# Patient Record
Sex: Female | Born: 1955
Health system: Southern US, Community
[De-identification: ages and names within clinical notes are randomized; demographics above are authoritative.]

## PROBLEM LIST (undated history)

## (undated) DIAGNOSIS — M8580 Other specified disorders of bone density and structure, unspecified site: Secondary | ICD-10-CM

## (undated) DIAGNOSIS — E785 Hyperlipidemia, unspecified: Secondary | ICD-10-CM

## (undated) HISTORY — DX: Other specified disorders of bone density and structure, unspecified site: M85.80

---
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
## 1999-10-11 ENCOUNTER — Encounter: Payer: Self-pay | Admitting: Obstetrics and Gynecology

## 1999-10-11 ENCOUNTER — Encounter: Admission: RE | Admit: 1999-10-11 | Discharge: 1999-10-11 | Payer: Self-pay | Admitting: Obstetrics and Gynecology

## 1999-10-24 ENCOUNTER — Other Ambulatory Visit: Admission: RE | Admit: 1999-10-24 | Discharge: 1999-10-24 | Payer: Self-pay | Admitting: *Deleted

## 2000-11-24 ENCOUNTER — Encounter: Admission: RE | Admit: 2000-11-24 | Discharge: 2000-11-24 | Payer: Self-pay | Admitting: Obstetrics and Gynecology

## 2000-11-24 ENCOUNTER — Encounter: Payer: Self-pay | Admitting: Obstetrics and Gynecology

## 2000-12-01 ENCOUNTER — Other Ambulatory Visit: Admission: RE | Admit: 2000-12-01 | Discharge: 2000-12-01 | Payer: Self-pay | Admitting: Obstetrics and Gynecology

## 2002-04-19 ENCOUNTER — Encounter: Admission: RE | Admit: 2002-04-19 | Discharge: 2002-04-19 | Payer: Self-pay | Admitting: Internal Medicine

## 2002-04-19 ENCOUNTER — Encounter: Payer: Self-pay | Admitting: Internal Medicine

## 2002-07-08 ENCOUNTER — Other Ambulatory Visit: Admission: RE | Admit: 2002-07-08 | Discharge: 2002-07-08 | Payer: Self-pay | Admitting: Obstetrics and Gynecology

## 2003-01-04 ENCOUNTER — Encounter: Payer: Self-pay | Admitting: Obstetrics and Gynecology

## 2003-01-04 ENCOUNTER — Encounter: Admission: RE | Admit: 2003-01-04 | Discharge: 2003-01-04 | Payer: Self-pay | Admitting: Obstetrics and Gynecology

## 2004-02-20 ENCOUNTER — Encounter: Admission: RE | Admit: 2004-02-20 | Discharge: 2004-02-20 | Payer: Self-pay | Admitting: Obstetrics and Gynecology

## 2004-04-23 ENCOUNTER — Emergency Department (HOSPITAL_COMMUNITY): Admission: EM | Admit: 2004-04-23 | Discharge: 2004-04-23 | Payer: Self-pay | Admitting: Family Medicine

## 2004-07-02 ENCOUNTER — Emergency Department (HOSPITAL_COMMUNITY): Admission: EM | Admit: 2004-07-02 | Discharge: 2004-07-02 | Payer: Self-pay | Admitting: *Deleted

## 2004-12-03 ENCOUNTER — Encounter: Admission: RE | Admit: 2004-12-03 | Discharge: 2004-12-03 | Payer: Self-pay | Admitting: Internal Medicine

## 2004-12-10 ENCOUNTER — Encounter: Admission: RE | Admit: 2004-12-10 | Discharge: 2004-12-10 | Payer: Self-pay | Admitting: Internal Medicine

## 2005-05-20 ENCOUNTER — Ambulatory Visit (HOSPITAL_COMMUNITY): Admission: RE | Admit: 2005-05-20 | Discharge: 2005-05-20 | Payer: Self-pay | Admitting: Internal Medicine

## 2005-06-05 ENCOUNTER — Encounter: Admission: RE | Admit: 2005-06-05 | Discharge: 2005-06-05 | Payer: Self-pay | Admitting: Internal Medicine

## 2005-10-16 ENCOUNTER — Encounter: Admission: RE | Admit: 2005-10-16 | Discharge: 2005-10-16 | Payer: Self-pay | Admitting: *Deleted

## 2008-05-05 ENCOUNTER — Encounter: Admission: RE | Admit: 2008-05-05 | Discharge: 2008-05-05 | Payer: Self-pay | Admitting: Obstetrics and Gynecology

## 2008-05-10 ENCOUNTER — Encounter: Admission: RE | Admit: 2008-05-10 | Discharge: 2008-05-10 | Payer: Self-pay | Admitting: Obstetrics and Gynecology

## 2008-11-06 ENCOUNTER — Encounter: Admission: RE | Admit: 2008-11-06 | Discharge: 2008-11-06 | Payer: Self-pay | Admitting: Obstetrics and Gynecology

## 2009-08-10 ENCOUNTER — Encounter: Admission: RE | Admit: 2009-08-10 | Discharge: 2009-08-10 | Payer: Self-pay | Admitting: Obstetrics and Gynecology

## 2011-05-19 ENCOUNTER — Encounter: Payer: Self-pay | Admitting: Internal Medicine

## 2011-05-20 ENCOUNTER — Encounter: Payer: Self-pay | Admitting: Internal Medicine

## 2011-05-20 ENCOUNTER — Ambulatory Visit (INDEPENDENT_AMBULATORY_CARE_PROVIDER_SITE_OTHER): Payer: BC Managed Care – PPO | Admitting: Internal Medicine

## 2011-05-20 VITALS — BP 132/80 | HR 70 | Temp 98.4°F | Ht 65.0 in | Wt 196.0 lb

## 2011-05-20 DIAGNOSIS — J45909 Unspecified asthma, uncomplicated: Secondary | ICD-10-CM

## 2011-05-20 DIAGNOSIS — R52 Pain, unspecified: Secondary | ICD-10-CM

## 2011-05-20 DIAGNOSIS — K219 Gastro-esophageal reflux disease without esophagitis: Secondary | ICD-10-CM

## 2011-05-22 ENCOUNTER — Ambulatory Visit
Admission: RE | Admit: 2011-05-22 | Discharge: 2011-05-22 | Disposition: A | Discharge status: Home / Self Care | Payer: BC Managed Care – PPO | Source: Ambulatory Visit | Attending: Internal Medicine | Admitting: Internal Medicine

## 2011-05-22 DIAGNOSIS — R52 Pain, unspecified: Secondary | ICD-10-CM

## 2011-05-23 ENCOUNTER — Other Ambulatory Visit: Payer: Self-pay | Admitting: Internal Medicine

## 2011-05-23 ENCOUNTER — Telehealth: Payer: Self-pay

## 2011-05-23 DIAGNOSIS — M5410 Radiculopathy, site unspecified: Secondary | ICD-10-CM

## 2011-05-23 DIAGNOSIS — R937 Abnormal findings on diagnostic imaging of other parts of musculoskeletal system: Secondary | ICD-10-CM

## 2011-05-23 NOTE — Telephone Encounter (Signed)
Patient in need of additional imaging, per Radiologist due to C7-T1disc bulge with spurring /epidural hemorrhage. Ordered in epic MRI c spine w/contrast

## 2011-05-23 NOTE — Telephone Encounter (Signed)
Patient informed of MRI C spine results

## 2011-05-24 ENCOUNTER — Ambulatory Visit
Admission: RE | Admit: 2011-05-24 | Discharge: 2011-05-24 | Disposition: A | Discharge status: Home / Self Care | Payer: BC Managed Care – PPO | Source: Ambulatory Visit | Attending: Internal Medicine | Admitting: Internal Medicine

## 2011-05-24 ENCOUNTER — Other Ambulatory Visit: Payer: Self-pay | Admitting: Internal Medicine

## 2011-05-24 DIAGNOSIS — M5410 Radiculopathy, site unspecified: Secondary | ICD-10-CM

## 2011-05-24 MED ORDER — GADOBENATE DIMEGLUMINE 529 MG/ML IV SOLN
19.0000 mL | Freq: Once | INTRAVENOUS | Status: AC | PRN
  Administered 2011-05-24: 19 mL via INTRAVENOUS

## 2011-05-26 ENCOUNTER — Encounter: Payer: Self-pay | Admitting: Internal Medicine

## 2011-05-26 NOTE — Progress Notes (Signed)
  Subjective:    Patient ID: [name], female    DOB: May 24, 1956, 55 y.o.   MRN: [id number]  HPI patient has recently developed neck and right arm pain with right hand numbness. Has pain in right shoulder. Tried over-the-counter analgesics without relief. Doesn't recall any injury to neck or arm. All fingers are numb in her right hand. Never had anything like this before. Patient has a history of GE reflux, asthma, osteopenia. History of left lower lobe pneumonia March 2006. No known drug allergies but codeine causes dizziness. OB/GYN is [facility]. 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 Is married. Not seen here since 2009.    Review of Systems     Objective:   Physical Exam cranial nerves II through XII are grossly intact. Has tenderness right trapezius and right paracervical muscles. Numbness in all 5 fingers right hand. Muscle strength 4/5 in right upper extremity 5 over 5 in left upper extremity. Deep tendon reflexes 1+ and symmetrical in right upper extremity.        Assessment & Plan:  Neck pain and right cervical radiculopathy rule out herniated disc   Plan patient is to be placed on a Sterapred DS 10 mg 6 day dosepak, Flexeril 10 mg (#30) 1/2-1 by mouth each bedtime. Order CT of the cervical spine. Followup in one week.

## 2011-05-29 ENCOUNTER — Encounter: Payer: Self-pay | Admitting: Internal Medicine

## 2011-05-29 ENCOUNTER — Ambulatory Visit (INDEPENDENT_AMBULATORY_CARE_PROVIDER_SITE_OTHER): Payer: BC Managed Care – PPO | Admitting: Internal Medicine

## 2011-05-29 DIAGNOSIS — M5410 Radiculopathy, site unspecified: Secondary | ICD-10-CM

## 2011-05-29 DIAGNOSIS — IMO0002 Reserved for concepts with insufficient information to code with codable children: Secondary | ICD-10-CM

## 2011-05-29 DIAGNOSIS — M5412 Radiculopathy, cervical region: Secondary | ICD-10-CM

## 2011-05-29 NOTE — Progress Notes (Signed)
  Subjective:    Patient ID: [name], female    DOB: 1955-10-22, 55 y.o.   MRN: [id number]  HPI patient in today for followup of pain in lower paracervical spine area and radiculopathy right arm. Last visit was treated with Flexeril, Sterapred DS 10 mg 6 day dosepak. She had an MRI without contrast showing disc bulge with some uncinate spurring with moderate right and borderline left foraminal stenosis. However radiologist thought he saw an abnormality in the upper cervical spine in the epidural soft tissue. This turns out to be a prominent venous plexus. No abnormal enhancement identified. No hematoma. No abscess. Patient was reassured. Patient says numbness in her right hand is a bit better but not a lot. Still having considerable lower neck pain. Has had to take Flexeril at night after getting home from work. It has been one week since we started treatment. Last weekend helped her family heart is some grapes. She has been doing this recently which may have aggravated her situation. I would like for her not to do this until she is better. Also reminded no heavy lifting. 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    Review of Systems     Objective:   Physical Exam persistent numbness right hand and pain lower paracervical spine. Able to type at work.        Assessment & Plan:  Disc bulge C7-T1 with moderate right foraminal stenosis (right radiculopathy)  Refill Flexeril 10 mg #90 one half to one by mouth 3 times a day  Repeat Sterapred DS 10 mg 6 day dosepak. Appointment with neurosurgeon.

## 2011-05-30 ENCOUNTER — Ambulatory Visit: Payer: BC Managed Care – PPO | Admitting: Internal Medicine

## 2011-05-30 ENCOUNTER — Telehealth: Payer: Self-pay

## 2011-05-30 NOTE — Telephone Encounter (Signed)
Patient scheduled for appointment with Dr. Venetia Maxon on Jun 16, 2011 at 8:30 a.m.Marland Kitchen Patient  aware

## 2011-07-24 ENCOUNTER — Other Ambulatory Visit: Payer: Self-pay | Admitting: Neurosurgery

## 2011-08-25 ENCOUNTER — Encounter (HOSPITAL_COMMUNITY): Payer: Self-pay | Admitting: Pharmacy Technician

## 2011-09-03 NOTE — Pre-Procedure Instructions (Signed)
20 YOSHINO BROCCOLI  09/03/2011   Your procedure is scheduled on:09-11-2011 @ 7:30 AM Report to [facility] at 5:30 AM.  Call this number if you have problems the morning of surgery: [phone]   Remember:   Do not eat food:After Midnight.  May have clear liquids: up to 4 Hours before arrival.  Clear liquids include soda, tea, black coffee, apple or grape juice, broth. Until 1:30 AM  Take these medicines the morning of surgery with A SIP OF WATER:   Do not wear jewelry, make-up or nail polish.  Do not wear lotions, powders, or perfumes. You may wear deodorant.  Do not shave 48 hours prior to surgery.  Do not bring valuables to the hospital.  Contacts, dentures or bridgework may not be worn into surgery.  Leave suitcase in the car. After surgery it may be brought to your room.  For patients admitted to the hospital, checkout time is 11:00 AM the day of discharge.   Patients discharged the day of surgery will not be allowed to drive home.  Name and phone number of your driver:  Special Instructions: CHG Shower Use Special Wash: 1/2 bottle night before surgery and 1/2 bottle morning of surgery.   Please read over the following fact sheets that you were given: Pain Booklet, MRSA Information and Surgical Site Infection Prevention

## 2011-09-04 ENCOUNTER — Encounter (HOSPITAL_COMMUNITY): Payer: Self-pay

## 2011-09-04 ENCOUNTER — Encounter (HOSPITAL_COMMUNITY)
Admission: RE | Admit: 2011-09-04 | Discharge: 2011-09-04 | Disposition: A | Discharge status: Home / Self Care | Payer: BC Managed Care – PPO | Source: Ambulatory Visit | Attending: Neurosurgery | Admitting: Neurosurgery

## 2011-09-04 HISTORY — DX: Hyperlipidemia, unspecified: E78.5

## 2011-09-04 LAB — CBC
HCT: 39 % (ref 36.0–46.0)
Hemoglobin: 12.8 g/dL (ref 12.0–15.0)
RBC: 4.45 MIL/uL (ref 3.87–5.11)
RDW: 13.3 % (ref 11.5–15.5)

## 2011-09-10 MED ORDER — CEFAZOLIN SODIUM 1-5 GM-% IV SOLN
1.0000 g | INTRAVENOUS | Status: AC
  Administered 2011-09-11: 1 g via INTRAVENOUS
  Filled 2011-09-10: qty 50

## 2011-09-11 ENCOUNTER — Ambulatory Visit (HOSPITAL_COMMUNITY)
Admission: RE | Admit: 2011-09-11 | Discharge: 2011-09-12 | Disposition: A | Discharge status: Home / Self Care | Payer: BC Managed Care – PPO | Source: Ambulatory Visit | Attending: Neurosurgery | Admitting: Neurosurgery

## 2011-09-11 ENCOUNTER — Encounter (HOSPITAL_COMMUNITY)
Admission: RE | Disposition: A | Discharge status: Home / Self Care | Payer: Self-pay | Source: Ambulatory Visit | Attending: Neurosurgery

## 2011-09-11 ENCOUNTER — Encounter (HOSPITAL_COMMUNITY): Payer: Self-pay | Admitting: *Deleted

## 2011-09-11 ENCOUNTER — Ambulatory Visit (HOSPITAL_COMMUNITY): Payer: BC Managed Care – PPO | Admitting: Anesthesiology

## 2011-09-11 ENCOUNTER — Encounter (HOSPITAL_COMMUNITY): Payer: Self-pay | Admitting: Anesthesiology

## 2011-09-11 ENCOUNTER — Ambulatory Visit (HOSPITAL_COMMUNITY): Payer: BC Managed Care – PPO

## 2011-09-11 DIAGNOSIS — J45909 Unspecified asthma, uncomplicated: Secondary | ICD-10-CM | POA: Insufficient documentation

## 2011-09-11 DIAGNOSIS — E785 Hyperlipidemia, unspecified: Secondary | ICD-10-CM | POA: Insufficient documentation

## 2011-09-11 DIAGNOSIS — Z01812 Encounter for preprocedural laboratory examination: Secondary | ICD-10-CM | POA: Insufficient documentation

## 2011-09-11 DIAGNOSIS — M5020 Other cervical disc displacement, unspecified cervical region: Secondary | ICD-10-CM | POA: Insufficient documentation

## 2011-09-11 DIAGNOSIS — K219 Gastro-esophageal reflux disease without esophagitis: Secondary | ICD-10-CM | POA: Insufficient documentation

## 2011-09-11 DIAGNOSIS — M47812 Spondylosis without myelopathy or radiculopathy, cervical region: Secondary | ICD-10-CM | POA: Insufficient documentation

## 2011-09-11 HISTORY — PX: ANTERIOR CERVICAL DECOMP/DISCECTOMY FUSION: SHX1161

## 2011-09-11 SURGERY — ANTERIOR CERVICAL DECOMPRESSION/DISCECTOMY FUSION 1 LEVEL
Anesthesia: General | Site: Neck | Wound class: Clean

## 2011-09-11 MED ORDER — PROPOFOL 10 MG/ML IV EMUL
INTRAVENOUS | Status: DC | PRN
  Administered 2011-09-11: 160 mg via INTRAVENOUS
  Administered 2011-09-11: 40 mg via INTRAVENOUS

## 2011-09-11 MED ORDER — PROMETHAZINE HCL 25 MG/ML IJ SOLN
INTRAMUSCULAR | Status: AC
  Filled 2011-09-11: qty 1

## 2011-09-11 MED ORDER — HYDROCODONE-ACETAMINOPHEN 5-325 MG PO TABS
1.0000 | ORAL_TABLET | ORAL | Status: DC | PRN
  Administered 2011-09-11 – 2011-09-12 (×5): 1 via ORAL
  Filled 2011-09-11 (×5): qty 1

## 2011-09-11 MED ORDER — ZOLPIDEM TARTRATE 5 MG PO TABS: 10.0000 mg | ORAL_TABLET | Freq: Every evening | ORAL | Status: DC | PRN

## 2011-09-11 MED ORDER — DEXAMETHASONE SODIUM PHOSPHATE 4 MG/ML IJ SOLN
INTRAMUSCULAR | Status: DC | PRN
  Administered 2011-09-11: 10 mg via INTRAVENOUS

## 2011-09-11 MED ORDER — MIDAZOLAM HCL 5 MG/5ML IJ SOLN
INTRAMUSCULAR | Status: DC | PRN
  Administered 2011-09-11: 2 mg via INTRAVENOUS

## 2011-09-11 MED ORDER — ACETAMINOPHEN 325 MG PO TABS: 650.0000 mg | ORAL_TABLET | ORAL | Status: DC | PRN

## 2011-09-11 MED ORDER — PROMETHAZINE HCL 25 MG/ML IJ SOLN
6.2500 mg | INTRAMUSCULAR | Status: DC | PRN
  Administered 2011-09-11: 6.25 mg via INTRAVENOUS

## 2011-09-11 MED ORDER — ONDANSETRON HCL 4 MG/2ML IJ SOLN
INTRAMUSCULAR | Status: DC | PRN
  Administered 2011-09-11: 4 mg via INTRAVENOUS

## 2011-09-11 MED ORDER — CEFAZOLIN SODIUM 1-5 GM-% IV SOLN
1.0000 g | Freq: Three times a day (TID) | INTRAVENOUS | Status: AC
  Administered 2011-09-11 (×2): 1 g via INTRAVENOUS
  Filled 2011-09-11 (×2): qty 50

## 2011-09-11 MED ORDER — BUPIVACAINE HCL (PF) 0.5 % IJ SOLN
INTRAMUSCULAR | Status: DC | PRN
  Administered 2011-09-11: 30 mL

## 2011-09-11 MED ORDER — LIDOCAINE HCL (CARDIAC) 20 MG/ML IV SOLN
INTRAVENOUS | Status: DC | PRN
  Administered 2011-09-11: 60 mg via INTRAVENOUS

## 2011-09-11 MED ORDER — HEMOSTATIC AGENTS (NO CHARGE) OPTIME
TOPICAL | Status: DC | PRN
  Administered 2011-09-11: 1 via TOPICAL

## 2011-09-11 MED ORDER — ALUM & MAG HYDROXIDE-SIMETH 200-200-20 MG/5ML PO SUSP: 30.0000 mL | Freq: Four times a day (QID) | ORAL | Status: DC | PRN

## 2011-09-11 MED ORDER — KCL IN DEXTROSE-NACL 20-5-0.45 MEQ/L-%-% IV SOLN
INTRAVENOUS | Status: DC
  Filled 2011-09-11 (×3): qty 1000

## 2011-09-11 MED ORDER — MORPHINE SULFATE 4 MG/ML IJ SOLN: 1.0000 mg | INTRAMUSCULAR | Status: DC | PRN

## 2011-09-11 MED ORDER — SODIUM CHLORIDE 0.9 % IV SOLN
INTRAVENOUS | Status: AC
  Filled 2011-09-11: qty 500

## 2011-09-11 MED ORDER — HYDROMORPHONE HCL PF 1 MG/ML IJ SOLN
INTRAMUSCULAR | Status: AC
  Filled 2011-09-11: qty 1

## 2011-09-11 MED ORDER — SODIUM CHLORIDE 0.9 % IR SOLN
Status: DC | PRN
  Administered 2011-09-11: 09:00:00

## 2011-09-11 MED ORDER — METHOCARBAMOL 500 MG PO TABS
500.0000 mg | ORAL_TABLET | Freq: Four times a day (QID) | ORAL | Status: DC | PRN
  Administered 2011-09-11 – 2011-09-12 (×3): 500 mg via ORAL
  Filled 2011-09-11 (×3): qty 1

## 2011-09-11 MED ORDER — ROCURONIUM BROMIDE 100 MG/10ML IV SOLN
INTRAVENOUS | Status: DC | PRN
  Administered 2011-09-11: 50 mg via INTRAVENOUS

## 2011-09-11 MED ORDER — LACTATED RINGERS IV SOLN
INTRAVENOUS | Status: DC | PRN
  Administered 2011-09-11 (×2): via INTRAVENOUS

## 2011-09-11 MED ORDER — THROMBIN 5000 UNITS EX KIT
PACK | CUTANEOUS | Status: DC | PRN
  Administered 2011-09-11 (×2): 5000 [IU] via TOPICAL

## 2011-09-11 MED ORDER — DIAZEPAM 5 MG PO TABS
5.0000 mg | ORAL_TABLET | Freq: Four times a day (QID) | ORAL | Status: DC | PRN
Start: 2011-09-11 — End: 2011-09-12

## 2011-09-11 MED ORDER — DOCUSATE SODIUM 100 MG PO CAPS
100.0000 mg | ORAL_CAPSULE | Freq: Two times a day (BID) | ORAL | Status: DC
  Administered 2011-09-11 – 2011-09-12 (×2): 100 mg via ORAL
  Filled 2011-09-11 (×2): qty 1

## 2011-09-11 MED ORDER — LORAZEPAM 2 MG/ML IJ SOLN: 1.0000 mg | Freq: Once | INTRAMUSCULAR | Status: DC | PRN

## 2011-09-11 MED ORDER — ACETAMINOPHEN 650 MG RE SUPP: 650.0000 mg | RECTAL | Status: DC | PRN

## 2011-09-11 MED ORDER — MENTHOL 3 MG MT LOZG: 1.0000 | LOZENGE | OROMUCOSAL | Status: DC | PRN

## 2011-09-11 MED ORDER — OXYCODONE-ACETAMINOPHEN 5-325 MG PO TABS: 1.0000 | ORAL_TABLET | ORAL | Status: DC | PRN

## 2011-09-11 MED ORDER — GLYCOPYRROLATE 0.2 MG/ML IJ SOLN
INTRAMUSCULAR | Status: DC | PRN
  Administered 2011-09-11: .7 mg via INTRAVENOUS

## 2011-09-11 MED ORDER — FENTANYL CITRATE 0.05 MG/ML IJ SOLN
INTRAMUSCULAR | Status: DC | PRN
  Administered 2011-09-11: 150 ug via INTRAVENOUS
  Administered 2011-09-11: 100 ug via INTRAVENOUS

## 2011-09-11 MED ORDER — NEOSTIGMINE METHYLSULFATE 1 MG/ML IJ SOLN
INTRAMUSCULAR | Status: DC | PRN
  Administered 2011-09-11: 5 mg via INTRAVENOUS

## 2011-09-11 MED ORDER — ONDANSETRON HCL 4 MG/2ML IJ SOLN: 4.0000 mg | INTRAMUSCULAR | Status: DC | PRN

## 2011-09-11 MED ORDER — SODIUM CHLORIDE 0.9 % IJ SOLN
3.0000 mL | Freq: Two times a day (BID) | INTRAMUSCULAR | Status: DC
  Administered 2011-09-11 (×2): 3 mL via INTRAVENOUS

## 2011-09-11 MED ORDER — SODIUM CHLORIDE 0.9 % IJ SOLN: 3.0000 mL | INTRAMUSCULAR | Status: DC | PRN

## 2011-09-11 MED ORDER — BACITRACIN 50000 UNITS IM SOLR
INTRAMUSCULAR | Status: AC
  Filled 2011-09-11: qty 50000

## 2011-09-11 MED ORDER — PHENOL 1.4 % MT LIQD: 1.0000 | OROMUCOSAL | Status: DC | PRN

## 2011-09-11 MED ORDER — 0.9 % SODIUM CHLORIDE (POUR BTL) OPTIME
TOPICAL | Status: DC | PRN
  Administered 2011-09-11: 1000 mL

## 2011-09-11 MED ORDER — LIDOCAINE-EPINEPHRINE 1 %-1:100000 IJ SOLN
INTRAMUSCULAR | Status: DC | PRN
  Administered 2011-09-11: 20 mL

## 2011-09-11 MED ORDER — HYDROMORPHONE HCL PF 1 MG/ML IJ SOLN
0.2500 mg | INTRAMUSCULAR | Status: DC | PRN
Start: 2011-09-11 — End: 2011-09-11
  Administered 2011-09-11: 0.5 mg via INTRAVENOUS

## 2011-09-11 SURGICAL SUPPLY — 75 items
BAG DECANTER FOR FLEXI CONT (MISCELLANEOUS) ×2 IMPLANT
BANDAGE GAUZE ELAST BULKY 4 IN (GAUZE/BANDAGES/DRESSINGS) ×4 IMPLANT
BENZOIN TINCTURE PRP APPL 2/3 (GAUZE/BANDAGES/DRESSINGS) IMPLANT
BIT DRILL 14MM (INSTRUMENTS) ×1 IMPLANT
BIT DRILL NEURO 2X3.1 SFT TUCH (MISCELLANEOUS) ×1 IMPLANT
BLADE ULTRA TIP 2M (BLADE) ×2 IMPLANT
BUR BARREL STRAIGHT FLUTE 4.0 (BURR) ×2 IMPLANT
CAGE CERVICAL 8 (Cage) ×1 IMPLANT
CANISTER SUCTION 2500CC (MISCELLANEOUS) ×2 IMPLANT
CLOTH BEACON ORANGE TIMEOUT ST (SAFETY) ×2 IMPLANT
CONT SPEC 4OZ CLIKSEAL STRL BL (MISCELLANEOUS) ×2 IMPLANT
COVER MAYO STAND STRL (DRAPES) ×2 IMPLANT
DERMABOND ADVANCED (GAUZE/BANDAGES/DRESSINGS) ×1
DERMABOND ADVANCED .7 DNX12 (GAUZE/BANDAGES/DRESSINGS) ×1 IMPLANT
DRAPE LAPAROTOMY 100X72 PEDS (DRAPES) ×2 IMPLANT
DRAPE MICROSCOPE LEICA (MISCELLANEOUS) ×2 IMPLANT
DRAPE POUCH INSTRU U-SHP 10X18 (DRAPES) ×2 IMPLANT
DRAPE PROXIMA HALF (DRAPES) IMPLANT
DRESSING TELFA 8X3 (GAUZE/BANDAGES/DRESSINGS) IMPLANT
DRILL 14MM (INSTRUMENTS) ×2
DRILL NEURO 2X3.1 SOFT TOUCH (MISCELLANEOUS) ×2
DURAPREP 6ML APPLICATOR 50/CS (WOUND CARE) ×4 IMPLANT
ELECT COATED BLADE 2.86 ST (ELECTRODE) ×2 IMPLANT
ELECT REM PT RETURN 9FT ADLT (ELECTROSURGICAL) ×2
ELECTRODE REM PT RTRN 9FT ADLT (ELECTROSURGICAL) ×1 IMPLANT
GAUZE SPONGE 4X4 16PLY XRAY LF (GAUZE/BANDAGES/DRESSINGS) IMPLANT
GLOVE BIO SURGEON STRL SZ8 (GLOVE) IMPLANT
GLOVE BIOGEL PI IND STRL 7.0 (GLOVE) ×1 IMPLANT
GLOVE BIOGEL PI IND STRL 8 (GLOVE) ×1 IMPLANT
GLOVE BIOGEL PI IND STRL 8.5 (GLOVE) ×1 IMPLANT
GLOVE BIOGEL PI INDICATOR 7.0 (GLOVE) ×1
GLOVE BIOGEL PI INDICATOR 8 (GLOVE) ×1
GLOVE BIOGEL PI INDICATOR 8.5 (GLOVE) ×1
GLOVE ECLIPSE 7.5 STRL STRAW (GLOVE) IMPLANT
GLOVE EXAM NITRILE LRG STRL (GLOVE) IMPLANT
GLOVE EXAM NITRILE MD LF STRL (GLOVE) IMPLANT
GLOVE EXAM NITRILE XL STR (GLOVE) IMPLANT
GLOVE EXAM NITRILE XS STR PU (GLOVE) IMPLANT
GLOVE SURG SS PI 6.5 STRL IVOR (GLOVE) ×2 IMPLANT
GLOVE SURG SS PI 7.5 STRL IVOR (GLOVE) ×4 IMPLANT
GLOVE SURG SS PI 8.0 STRL IVOR (GLOVE) ×4 IMPLANT
GLOVE SURG SS PI 8.5 STRL IVOR (GLOVE) ×1
GLOVE SURG SS PI 8.5 STRL STRW (GLOVE) ×1 IMPLANT
GOWN BRE IMP SLV AUR LG STRL (GOWN DISPOSABLE) IMPLANT
GOWN BRE IMP SLV AUR XL STRL (GOWN DISPOSABLE) ×8 IMPLANT
GOWN STRL REIN 2XL LVL4 (GOWN DISPOSABLE) IMPLANT
HEAD HALTER (SOFTGOODS) ×2 IMPLANT
KIT BASIN OR (CUSTOM PROCEDURE TRAY) ×2 IMPLANT
KIT ROOM TURNOVER OR (KITS) ×2 IMPLANT
NEEDLE HYPO 18GX1.5 BLUNT FILL (NEEDLE) ×2 IMPLANT
NEEDLE HYPO 25X1 1.5 SAFETY (NEEDLE) ×2 IMPLANT
NEEDLE SPNL 22GX3.5 QUINCKE BK (NEEDLE) ×4 IMPLANT
NS IRRIG 1000ML POUR BTL (IV SOLUTION) ×2 IMPLANT
PACK LAMINECTOMY NEURO (CUSTOM PROCEDURE TRAY) ×2 IMPLANT
PAD ARMBOARD 7.5X6 YLW CONV (MISCELLANEOUS) ×6 IMPLANT
PIN DISTRACTION 14MM (PIN) ×4 IMPLANT
PLATE 14MM (Plate) ×2 IMPLANT
PROFUSE BONE SIZE 2 (Bone Implant) ×2 IMPLANT
PUREGEN 0.5ML SML (Bone Implant) ×2 IMPLANT
RUBBERBAND STERILE (MISCELLANEOUS) ×4 IMPLANT
SCREW 14MM (Screw) ×8 IMPLANT
SPACER SPNL 7D LRG 16X14X8XNS (Cage) ×1 IMPLANT
SPCR SPNL 7D LRG 16X14X8XNS (Cage) ×1 IMPLANT
SPONGE GAUZE 4X4 12PLY (GAUZE/BANDAGES/DRESSINGS) IMPLANT
SPONGE INTESTINAL PEANUT (DISPOSABLE) ×2 IMPLANT
SPONGE SURGIFOAM ABS GEL SZ50 (HEMOSTASIS) ×2 IMPLANT
STAPLER SKIN PROX WIDE 3.9 (STAPLE) IMPLANT
STRIP CLOSURE SKIN 1/2X4 (GAUZE/BANDAGES/DRESSINGS) IMPLANT
SUT VIC AB 3-0 SH 8-18 (SUTURE) ×2 IMPLANT
SYR 20ML ECCENTRIC (SYRINGE) ×2 IMPLANT
SYR 3ML LL SCALE MARK (SYRINGE) ×2 IMPLANT
TOWEL OR 17X24 6PK STRL BLUE (TOWEL DISPOSABLE) ×2 IMPLANT
TOWEL OR 17X26 10 PK STRL BLUE (TOWEL DISPOSABLE) ×2 IMPLANT
TRAP SPECIMEN MUCOUS 40CC (MISCELLANEOUS) ×2 IMPLANT
WATER STERILE IRR 1000ML POUR (IV SOLUTION) ×2 IMPLANT

## 2011-09-11 NOTE — Preoperative (Signed)
Beta Blockers   Reason not to administer Beta Blockers:Not Applicable 

## 2011-09-11 NOTE — Plan of Care (Signed)
Problem: Consults Goal: Diagnosis - Spinal Surgery Outcome: Completed/Met Date Met:  09/11/11 Cervical Spine Fusion

## 2011-09-11 NOTE — Anesthesia Preprocedure Evaluation (Addendum)
Anesthesia Evaluation  Patient identified by MRN, date of birth, ID band Patient awake    Reviewed: Allergy & Precautions  History of Anesthesia Complications Negative for: history of anesthetic complications  Airway Mallampati: I TM Distance: >3 FB Neck ROM: Full    Dental No notable dental hx. (+) Teeth Intact   Pulmonary asthma ,  clear to auscultation        Cardiovascular neg cardio ROS Regular Normal    Neuro/Psych Negative Neurological ROS  Negative Psych ROS   GI/Hepatic Neg liver ROS, GERD-  Controlled,  Endo/Other  Negative Endocrine ROS  Renal/GU negative Renal ROS  Genitourinary negative   Musculoskeletal   Abdominal Normal abdominal exam  (+)   Peds  Hematology negative hematology ROS (+)   Anesthesia Other Findings   Reproductive/Obstetrics                           Anesthesia Physical Anesthesia Plan  ASA: II  Anesthesia Plan: General   Post-op Pain Management:    Induction: Intravenous  Airway Management Planned: Oral ETT  Additional Equipment:   Intra-op Plan:   Post-operative Plan: Extubation in OR  Informed Consent: I have reviewed the patients History and Physical, chart, labs and discussed the procedure including the risks, benefits and alternatives for the proposed anesthesia with the patient or authorized representative who has indicated his/her understanding and acceptance.   Dental advisory given  Plan Discussed with: CRNA and Surgeon  Anesthesia Plan Comments:        Anesthesia Quick Evaluation

## 2011-09-11 NOTE — Transfer of Care (Signed)
Immediate Anesthesia Transfer of Care Note  Patient: [name]  Procedure(s) Performed:  ANTERIOR CERVICAL DECOMPRESSION/DISCECTOMY FUSION 1 LEVEL - Cervical seven-Thoracic one anterior cervical decompression/discectomy fusion  Patient Location: PACU  Anesthesia Type: General  Level of Consciousness: awake and sedated  Airway & Oxygen Therapy: Patient Spontanous Breathing and Patient connected to face mask oxygen  Post-op Assessment: Report given to PACU RN, Post -op Vital signs reviewed and stable, Patient moving all extremities, Patient moving all extremities X 4 and Patient able to stick tongue midline  Post vital signs: Reviewed and stable  Complications: No apparent anesthesia complications

## 2011-09-11 NOTE — H&P (Signed)
[name] is an 56 y.o. female.   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 The patient on initial examination had marked right hand intrinsic and finger extensor weakness 4/5. She also had an examination suggestive of an older neuropathy and I recommended an EMG and nerve conduction studies be performed these were done and these demonstrate a right C8 radiculopathy affecting both APB and FDI muscles showing evidence of denervation I again met with the patient and went over her MRI and examination which demonstrates persistent significant right hand weakness and with marked foraminal stenosis at C7-T1 right greater than left we therefore proceeded recommended proceeding with anterior cervical decompression and fusion at C7-T1 level she wishes to proceed with this.  Past Medical History  Diagnosis Date  . Osteopenia   . Asthma     NO PROBLEMS IN4/5 YRS  . Hyperlipidemia     NOT ON MEDICATION    Past Surgical History  Procedure Date  . No past surgeries     Family History  Problem Relation Age of Onset  . Hyperlipidemia Mother   . Hypertension Mother   . Alcohol abuse Brother    Social History:  reports that she has never smoked. She does not have any smokeless tobacco history on file. She reports that she does not drink alcohol. Her drug history not on file.  Allergies:  Allergies  Allergen Reactions  . Latex Other (See Comments)    Patient states that it just bothers her skin a little bit.    Medications Prior to Admission  Medication Dose Route Frequency Provider Last Rate Last Dose  . bacitracin [address] UNITS injection           . ceFAZolin (ANCEF) IVPB 1 g/50 mL premix  1 g Intravenous 60 min Pre-Op Dorian Heckle, MD      .  HYDROmorphone (DILAUDID) injection 0.25-0.5 mg  0.25-0.5 mg Intravenous Q5 min PRN Bedelia Person, MD      . LORazepam (ATIVAN) injection 1 mg  1 mg Intravenous Once PRN Bedelia Person, MD      . promethazine (PHENERGAN) injection 6.25-12.5 mg  6.25-12.5 mg Intravenous Q15 min PRN Bedelia Person, MD      . sodium chloride 0.9 % infusion            No current outpatient prescriptions on file as of 09/11/2011.    No results found for this or any previous visit (from the past 48 hour(s)). No results found.  Review of Systems  Eyes:       Glasses  Cardiovascular:       Elevated cholesterol  Neurological:       Fainting spells    Blood pressure 172/90, pulse 78, temperature 98 F (36.7 C), temperature source Oral, resp. rate 18, SpO2 99.00%. Physical Exam  Constitutional: She is oriented to person, place, and time. She appears well-developed and well-nourished.  HENT:  Head: Normocephalic and atraumatic.  Eyes: Conjunctivae and EOM are normal. Pupils are equal, round, and reactive to light.  Neck: Normal range of motion. Neck supple.  Cardiovascular: Normal rate, regular rhythm, normal heart sounds and intact distal pulses.   Respiratory: Effort normal and breath sounds normal.  GI: Soft. Bowel sounds are normal.  Musculoskeletal: Normal range of  motion.  Neurological: She is alert and oriented to person, place, and time. She has normal reflexes.       Full strength all motor groups except Right Hand Intrinsic weakness at 4/5     Assessment/Plan Patient has a symptomatic right C8 radiculopathy and has a large foraminal spur causing significant right C7-T1 foraminal stenosis and is responsible for the right C8 radiculopathy. It is elected to take the patient to surgery for anterior cervical decompression and fusion at the C7-T1 level. Risks and benefits were discussed with the patient in detail she wishes to proceed she's had it for a soft cervical collar to  Michiah Mudry D 09/11/2011, 7:36 AM

## 2011-09-11 NOTE — Progress Notes (Signed)
Subjective: Patient reports "I feel much better than I thought I would."  Objective: Vital signs in last 24 hours: Temp:  [97.6 F (36.4 C)-98.4 F (36.9 C)] 98.4 F (36.9 C) (01/03 1200) Pulse Rate:  [67-99] 92  (01/03 1200) Resp:  [9-24] 20  (01/03 1200) BP: (126-175)/(62-99) 164/78 mmHg (01/03 1200) SpO2:  [96 %-100 %] 99 % (01/03 1200)  Intake/Output from previous day:   Intake/Output this shift: Total I/O In: 1760 [P.O.:360; I.V.:1400] Out: 5 [Blood:5]  Alert, conversant, with husband at bedside. Posterior neck/shoulder pain well-controlled with po meds. Improved strength BUE, hand intrinsics. Incision without erythema, swelling, or drainage.  Lab Results: No results found for this basename: WBC:2,HGB:2,HCT:2,PLT:2 in the last 72 hours BMET No results found for this basename: NA:2,K:2,CL:2,CO2:2,GLUCOSE:2,BUN:2,CREATININE:2,CALCIUM:2 in the last 72 hours  Studies/Results: Dg Cervical Spine 2-3 Views  09/11/2011  *RADIOLOGY REPORT*  Clinical Data: C7-T1 ACDF  CERVICAL SPINE - 2-3 VIEW  Comparison: July 16, 2011  Findings: On the first film, the anterior surgical instrument is at the C6-C7 level.  On the second film, there is anterior fusion of C7-T1.  IMPRESSION: Fluoroscopic guidance for localization for C7-T1 for ACDF.  Original Report Authenticated By: Brandon Melnick, M.D.    Assessment/Plan: Improved strength, decreased pain.  LOS: 0 days  D/C instructions reviewed with patient for likely am d/c to home.  OK to shower. No lotions, creams, ointments to incision. No lifting >10lbs. No driving [id number]. Wear collar at all times except showers.   Georgiann Cocker 09/11/2011, 3:20 PM

## 2011-09-11 NOTE — Op Note (Signed)
09/11/2011  9:38 AM  PATIENT:  [name]  56 y.o. female  PRE-OPERATIVE DIAGNOSIS:  Cervical seven-Thoracic one spondylosis, herniated nucleus pulposus, stenosis, radiculopathy  POST-OPERATIVE DIAGNOSIS:  Cervical seven-Thoracic one spondylosis, herniated nucleus pulposus, stenosis, radiculopathy  PROCEDURE:  Procedure(s): ANTERIOR CERVICAL DECOMPRESSION/DISCECTOMY FUSION 1 LEVEL  SURGEON:  Surgeon(s): Dorian Heckle, MD Rolanda Lundborg Kritzer  PHYSICIAN ASSISTANT:   ASSISTANTS: Poteat, RN   ANESTHESIA:   general  EBL:  Total I/O In: 1300 [I.V.:1300] Out: 5 [Blood:5]  BLOOD ADMINISTERED:none  DRAINS: none   LOCAL MEDICATIONS USED:  LIDOCAINE 6CC  SPECIMEN:  No Specimen  DISPOSITION OF SPECIMEN:  N/A  COUNTS:  YES  TOURNIQUET:  * No tourniquets in log *  DICTATION: Patient was brought to operating room and following the smooth and uncomplicated induction of general endotracheal anesthesia his head was placed on a horseshoe head holder he was placed in 5 pounds of Holter traction and her anterior neck was prepped and draped in usual sterile fashion. An incision was made on the left side of midline after infiltrating the skin and subcutaneous tissues with local lidocaine. The platysmal layer was incised and subplatysmal dissection was performed exposing the anterior border sternocleidomastoid muscle. Using blunt dissection the carotid sheath was kept lateral and trachea and esophagus kept medial exposing the anterior cervical spine. A bent spinal needle was placed it was felt to be the C6-7 level and this was confirmed on intraoperative x-ray. Longus coli muscles were taken down from the anterior cervical spine using electrocautery and key elevator and self-retaining retractor was placed at the C7-T1 level. The interspace was incised and a thorough discectomy was performed. Distraction pins were placed. Uncinate spurs and central spondylitic ridges were drilled down with a  high-speed drill. The spinal cord dura and both C8 nerve roots were widely decompressed. Hemostasis was assured. After trial sizing an 8 mm peek interbody cage was selected and packed with profuse block and autograft. Tamped into position and countersunk appropriately. Distraction weight was removed. A 14 mm trestle luxe anterior cervical plate was affixed to the cervical spine with 14 mm variable-angle screws 2 at C7 and 2 at T1. All screws were well-positioned and locking mechanisms were engaged. A final X-ray demonstrated well-positioned interbody graft and plate at the correct level. Soft tissues were inspected and found to be in good repair. The wound was irrigated. The platysma layer was closed with 3-0 Vicryl stitches and the skin was reapproximated with 3-0 Vicryl subcuticular stitches. The wound was dressed with Dermabond. Counts were correct at the end of the case. Patient was extubated and taken to recovery in stable and satisfactory condition.  PLAN OF CARE: Admit for overnight observation  PATIENT DISPOSITION:  PACU - hemodynamically stable.   Delay start of Pharmacological VTE agent (>24hrs) due to surgical blood loss or risk of bleeding:  YES

## 2011-09-11 NOTE — OR Nursing (Signed)
Preop pt states sensitivity to Latex gloves w/ powder. States they cause redness. Also, pt states L arm pain and R arm numbness, tingling and weakness.

## 2011-09-11 NOTE — Anesthesia Postprocedure Evaluation (Signed)
  Anesthesia Post-op Note  Patient: [name]  Procedure(s) Performed:  ANTERIOR CERVICAL DECOMPRESSION/DISCECTOMY FUSION 1 LEVEL - Cervical seven-Thoracic one anterior cervical decompression/discectomy fusion  Patient Location: PACU  Anesthesia Type: General  Level of Consciousness: awake and alert   Airway and Oxygen Therapy: Patient Spontanous Breathing  Post-op Pain: mild  Post-op Assessment: Post-op Vital signs reviewed, Patient's Cardiovascular Status Stable, Respiratory Function Stable, Patent Airway, No signs of Nausea or vomiting and Pain level controlled  Post-op Vital Signs: stable  Complications: No apparent anesthesia complications

## 2011-09-11 NOTE — H&P (Deleted)
Note cancelled.

## 2011-09-11 NOTE — Progress Notes (Signed)
Pt doing well postop with improved right hand intrinsic strength

## 2011-09-12 ENCOUNTER — Encounter (HOSPITAL_COMMUNITY): Payer: Self-pay | Admitting: Neurosurgery

## 2011-09-12 NOTE — Progress Notes (Signed)
Subjective: Patient reports "I feel really good!"  Objective: Vital signs in last 24 hours: Temp:  [98.1 F (36.7 C)-98.6 F (37 C)] 98.1 F (36.7 C) (01/04 0800) Pulse Rate:  [78-96] 96  (01/04 0800) Resp:  [14-20] 18  (01/04 0800) BP: (127-164)/(62-84) 127/70 mmHg (01/04 0800) SpO2:  [93 %-99 %] 96 % (01/04 0800)  Intake/Output from previous day: 01/03 0701 - 01/04 0700 In: 2508.8 [P.O.:720; I.V.:1738.8; IV Piggyback:50] Out: 5 [Blood:5] Intake/Output this shift: Total I/O In: 480 [P.O.:480] Out: -   Alert, conversant, without c/o pain. Incision without erythema, swelling, or drainage. Dermabond intact.  Good strength BUE. Right hand intrinsics remain improved.  Lab Results: No results found for this basename: WBC:2,HGB:2,HCT:2,PLT:2 in the last 72 hours BMET No results found for this basename: NA:2,K:2,CL:2,CO2:2,GLUCOSE:2,BUN:2,CREATININE:2,CALCIUM:2 in the last 72 hours  Studies/Results: Dg Cervical Spine 2-3 Views  09/11/2011  *RADIOLOGY REPORT*  Clinical Data: C7-T1 ACDF  CERVICAL SPINE - 2-3 VIEW  Comparison: July 16, 2011  Findings: On the first film, the anterior surgical instrument is at the C6-C7 level.  On the second film, there is anterior fusion of C7-T1.  IMPRESSION: Fluoroscopic guidance for localization for C7-T1 for ACDF.  Original Report Authenticated By: Brandon Melnick, M.D.    Assessment/Plan: Improved  LOS: 1 day  Pt aware of d/c instructions as reviewed yesterday and this visit. D/c IV & d/c to home.per Dr. Venetia Maxon.   [name] 09/12/2011, 10:59 AM

## 2011-09-12 NOTE — Progress Notes (Signed)
Utilization review completed. Copelyn Widmer, RN, BSN. 09/12/11  

## 2011-09-12 NOTE — Progress Notes (Deleted)

## 2011-09-22 NOTE — Discharge Summary (Signed)
Physician Discharge Summary  Patient ID: [name] MRN: [id number] DOB/AGE: [birth year] 56 y.o.  Admit date: 09/11/2011 Discharge date: 09/22/2011  Admission Diagnoses:Right C8 radiculopathy  Discharge Diagnoses: Right C8 radiculopathy  Active Problems:  * No active hospital problems. *    Discharged Condition: good  Hospital Course: Uncomplicated ACDF C7/T1  Consults: none  Significant Diagnostic Studies: None  Treatments: surgery:  Uncomplicated ACDF C7/T1 Discharge Exam: Blood pressure 127/70, pulse 96, temperature 98.1 F (36.7 C), temperature source Oral, resp. rate 18, SpO2 96.00%. Neurologic: Alert and oriented X 3, normal strength and tone. Normal symmetric reflexes. Normal coordination and gait Wound:CDI  Disposition: Home   Discharge Medication List as of 09/12/2011 11:55 AM    CONTINUE these medications which have NOT CHANGED   Details  Chlorpheniramine-PSE-Ibuprofen (ADVIL ALLERGY SINUS PO) Take 1 tablet by mouth daily as needed. For cold symptoms , Until Discontinued, Historical Med         Signed: Dorian Heckle, MD 09/22/2011, 8:54 AM

## 2012-08-25 ENCOUNTER — Other Ambulatory Visit: Payer: Self-pay | Admitting: Obstetrics and Gynecology

## 2012-08-25 ENCOUNTER — Encounter: Payer: Self-pay | Admitting: Internal Medicine

## 2012-08-25 DIAGNOSIS — R921 Mammographic calcification found on diagnostic imaging of breast: Secondary | ICD-10-CM

## 2012-09-08 HISTORY — PX: COLONOSCOPY: SHX174

## 2012-09-08 HISTORY — PX: POLYPECTOMY: SHX149

## 2012-09-14 ENCOUNTER — Other Ambulatory Visit: Payer: Self-pay | Admitting: Obstetrics and Gynecology

## 2012-09-14 DIAGNOSIS — M8580 Other specified disorders of bone density and structure, unspecified site: Secondary | ICD-10-CM

## 2012-09-20 ENCOUNTER — Other Ambulatory Visit: Payer: BC Managed Care – PPO

## 2012-09-30 ENCOUNTER — Ambulatory Visit
Admission: RE | Admit: 2012-09-30 | Discharge: 2012-09-30 | Disposition: A | Discharge status: Home / Self Care | Payer: BC Managed Care – PPO | Source: Ambulatory Visit | Attending: Obstetrics and Gynecology | Admitting: Obstetrics and Gynecology

## 2012-09-30 DIAGNOSIS — R921 Mammographic calcification found on diagnostic imaging of breast: Secondary | ICD-10-CM

## 2012-09-30 DIAGNOSIS — M8580 Other specified disorders of bone density and structure, unspecified site: Secondary | ICD-10-CM

## 2012-10-01 ENCOUNTER — Ambulatory Visit (AMBULATORY_SURGERY_CENTER): Payer: BC Managed Care – PPO | Admitting: *Deleted

## 2012-10-01 VITALS — Ht 65.0 in | Wt 200.2 lb

## 2012-10-01 DIAGNOSIS — Z1211 Encounter for screening for malignant neoplasm of colon: Secondary | ICD-10-CM

## 2012-10-01 MED ORDER — MOVIPREP 100 G PO SOLR: ORAL | Status: DC

## 2012-10-07 ENCOUNTER — Encounter: Payer: Self-pay | Admitting: Internal Medicine

## 2012-10-07 ENCOUNTER — Ambulatory Visit (INDEPENDENT_AMBULATORY_CARE_PROVIDER_SITE_OTHER): Payer: BC Managed Care – PPO | Admitting: Internal Medicine

## 2012-10-07 VITALS — BP 132/76 | HR 88 | Temp 98.4°F | Ht 64.75 in | Wt 198.0 lb

## 2012-10-07 DIAGNOSIS — M899 Disorder of bone, unspecified: Secondary | ICD-10-CM

## 2012-10-07 DIAGNOSIS — Z91038 Other insect allergy status: Secondary | ICD-10-CM

## 2012-10-07 DIAGNOSIS — J309 Allergic rhinitis, unspecified: Secondary | ICD-10-CM

## 2012-10-07 DIAGNOSIS — E785 Hyperlipidemia, unspecified: Secondary | ICD-10-CM

## 2012-10-07 DIAGNOSIS — M8580 Other specified disorders of bone density and structure, unspecified site: Secondary | ICD-10-CM

## 2012-10-07 DIAGNOSIS — Z91030 Bee allergy status: Secondary | ICD-10-CM

## 2012-10-07 MED ORDER — ATORVASTATIN CALCIUM 10 MG PO TABS: 10.0000 mg | ORAL_TABLET | Freq: Every day | ORAL | Status: DC

## 2012-10-07 NOTE — Progress Notes (Signed)
  Subjective:    Patient ID: [name], female    DOB: [birth year], 57 y.o.   MRN: [id number]  HPI 57 year old White female saw GYN recently and found to have significant hyperlipidemia. Had elevated cholesterol in 2006 but did not want to be on statin therapy. History of GE reflux, asthma and osteopenia. Had left lower lobe pneumonia March 2006. GYN is best Maurice March at [facility]. History of cervical disc disease with bulge at C7-T1 diagnosed in 2012. Patient had Pneumovax vaccine in 2006.  Old records indicate patient had total cholesterol of 241 in 2006, triglycerides of 189 and LDL cholesterol of 146. Statin therapy was suggested that patient wanted to wait.  History of false positive HIV test done at the [facility] in 2005.  She is allergic to bee stings.  Family history: Father family history is not known. Mother with history of hypertension hyperlipidemia and cardiac arrhythmia. One brother overweight with alcoholism. One brother with history of smoking. Mother brother whose health is okay. No sisters. 2 children a son and a daughter both adults.  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 Does not smoke or consume alcohol.  Has been allergy tested with skin tests showing minimal positivity from old. Spirometry showed significant postbronchodilator improvement.    Review of Systems     Objective:   Physical Exam skin warm and dry. Nodes none. HEENT exam: TMs and pharynx are clear. Neck is supple without thyromegaly JVD or carotid bruits. Chest clear to auscultation. Cardiac exam regular rate and rhythm normal S1 and S2. Extremities without edema.        Assessment & Plan:  History of hyperlipidemia  Family history of hypertension, hyperlipidemia and cardiac arrhythmia and mother.  History of allergy to bee stings  History of reversible airways disease/asthma  History of osteopenia  History of GE reflux  Plan:  Start Lipitor 10 mg daily generic and return in 3 months for office visit lipid panel liver functions.

## 2012-10-15 ENCOUNTER — Ambulatory Visit (AMBULATORY_SURGERY_CENTER): Payer: BC Managed Care – PPO | Admitting: Internal Medicine

## 2012-10-15 ENCOUNTER — Encounter: Payer: Self-pay | Admitting: Internal Medicine

## 2012-10-15 VITALS — BP 123/64 | HR 71 | Temp 97.2°F | Resp 19 | Ht 65.0 in | Wt 201.0 lb

## 2012-10-15 DIAGNOSIS — K635 Polyp of colon: Secondary | ICD-10-CM

## 2012-10-15 DIAGNOSIS — D126 Benign neoplasm of colon, unspecified: Secondary | ICD-10-CM

## 2012-10-15 DIAGNOSIS — Z1211 Encounter for screening for malignant neoplasm of colon: Secondary | ICD-10-CM

## 2012-10-15 MED ORDER — SODIUM CHLORIDE 0.9 % IV SOLN: 500.0000 mL | INTRAVENOUS | Status: DC

## 2012-10-15 NOTE — Op Note (Addendum)
[facility] [address] [place], [address]   COLONOSCOPY PROCEDURE REPORT  PATIENT: [name], [name]  MR#: [id number] BIRTHDATE: 1955/10/11 , 56  yrs. old GENDER: Female ENDOSCOPIST: Hart Carwin, MD REFERRED BY:  Sharlet Salina, M.D. PROCEDURE DATE:  10/15/2012 PROCEDURE:   Colonoscopy with cold biopsy polypectomy ASA CLASS:   Class I INDICATIONS:Average risk patient for colon cancer., father and brother had colon polyps MEDICATIONS: MAC sedation, administered by CRNA and propofol (Diprivan) 200mg  IV  DESCRIPTION OF PROCEDURE:   After the risks and benefits and of the procedure were explained, informed consent was obtained.  A digital rectal exam revealed no abnormalities of the rectum.    The LB [id number]  endoscope was introduced through the anus and advanced to the cecum, which was identified by both the appendix and ileocecal valve .  The quality of the prep was excellent, using MoviPrep .  The instrument was then slowly withdrawn as the colon was fully examined.     COLON FINDINGS: A diminutive sessile polyp was found in the rectum. A polypectomy was performed with cold forceps.  The resection was complete and the polyp tissue was completely retrieved. Retroflexed views revealed no abnormalities.     The scope was then withdrawn from the patient and the procedure completed.  COMPLICATIONS: There were no complications. ENDOSCOPIC IMPRESSION: Diminutive sessile polyp was found in the rectum; polypectomy was performed with cold forceps  RECOMMENDATIONS: 1.  Await pathology results 2.  High fiber diet   REPEAT EXAM: In 10 year(s)  for Colonoscopy.  cc:  _______________________________ eSignedHart Carwin, MD 10/15/2012 8:35 AM Revised: 10/15/2012 8:35 AM

## 2012-10-15 NOTE — Patient Instructions (Addendum)

## 2012-10-15 NOTE — Progress Notes (Signed)
Stable to RR 

## 2012-10-15 NOTE — Progress Notes (Signed)
Patient did not experience any of the following events: a burn prior to discharge; a fall within the facility; wrong site/side/patient/procedure/implant event; or a hospital transfer or hospital admission upon discharge from the facility. (G8907) Patient did not have preoperative order for IV antibiotic SSI prophylaxis. (G8918)  

## 2012-10-15 NOTE — Progress Notes (Signed)
Called to room to assist during endoscopic procedure.  Patient ID and intended procedure confirmed with present staff. Received instructions for my participation in the procedure from the performing physician.  

## 2012-10-18 ENCOUNTER — Telehealth: Payer: Self-pay | Admitting: *Deleted

## 2012-10-18 NOTE — Telephone Encounter (Signed)
  Follow up Call-  Call back number 10/15/2012  Post procedure Call Back phone  # [phone]  Permission to leave phone message Yes     Patient questions:  Do you have a fever, pain , or abdominal swelling? no Pain Score  0 *  Have you tolerated food without any problems? yes  Have you been able to return to your normal activities? yes  Do you have any questions about your discharge instructions: Diet   no Medications  no Follow up visit  no  Do you have questions or concerns about your Care? no  Actions: * If pain score is 4 or above: No action needed, pain <4.

## 2012-10-19 ENCOUNTER — Encounter: Payer: Self-pay | Admitting: Internal Medicine

## 2012-10-23 ENCOUNTER — Other Ambulatory Visit: Payer: Self-pay

## 2012-11-14 ENCOUNTER — Encounter: Payer: Self-pay | Admitting: Internal Medicine

## 2012-11-14 DIAGNOSIS — Z91030 Bee allergy status: Secondary | ICD-10-CM | POA: Insufficient documentation

## 2012-11-14 DIAGNOSIS — M8580 Other specified disorders of bone density and structure, unspecified site: Secondary | ICD-10-CM | POA: Insufficient documentation

## 2012-11-14 DIAGNOSIS — J309 Allergic rhinitis, unspecified: Secondary | ICD-10-CM | POA: Insufficient documentation

## 2012-11-14 NOTE — Patient Instructions (Addendum)
Start Lipitor 10 mg daily and return for fasting lipid panel liver functions in 3 months

## 2013-01-03 ENCOUNTER — Other Ambulatory Visit: Payer: BC Managed Care – PPO | Admitting: Internal Medicine

## 2013-01-03 DIAGNOSIS — Z79899 Other long term (current) drug therapy: Secondary | ICD-10-CM

## 2013-01-03 DIAGNOSIS — E785 Hyperlipidemia, unspecified: Secondary | ICD-10-CM

## 2013-01-03 DIAGNOSIS — E559 Vitamin D deficiency, unspecified: Secondary | ICD-10-CM

## 2013-01-03 LAB — HEPATIC FUNCTION PANEL
AST: 20 U/L (ref 0–37)
Albumin: 4.4 g/dL (ref 3.5–5.2)
Alkaline Phosphatase: 67 U/L (ref 39–117)
Total Bilirubin: 0.5 mg/dL (ref 0.3–1.2)
Total Protein: 6.8 g/dL (ref 6.0–8.3)

## 2013-01-03 LAB — LIPID PANEL
Cholesterol: 146 mg/dL (ref 0–200)
HDL: 39 mg/dL — ABNORMAL LOW (ref >39)
Total CHOL/HDL Ratio: 3.7 Ratio

## 2013-01-04 ENCOUNTER — Ambulatory Visit: Payer: BC Managed Care – PPO | Admitting: Internal Medicine

## 2013-01-04 LAB — VITAMIN D 25 HYDROXY (VIT D DEFICIENCY, FRACTURES): Vit D, 25-Hydroxy: 40 ng/mL (ref 30–89)

## 2013-01-07 ENCOUNTER — Encounter: Payer: Self-pay | Admitting: Internal Medicine

## 2013-01-07 ENCOUNTER — Ambulatory Visit (INDEPENDENT_AMBULATORY_CARE_PROVIDER_SITE_OTHER): Payer: BC Managed Care – PPO | Admitting: Internal Medicine

## 2013-01-07 VITALS — BP 118/82 | HR 76 | Temp 98.1°F | Wt 204.0 lb

## 2013-01-07 DIAGNOSIS — E559 Vitamin D deficiency, unspecified: Secondary | ICD-10-CM

## 2013-01-07 DIAGNOSIS — E785 Hyperlipidemia, unspecified: Secondary | ICD-10-CM

## 2013-01-08 DIAGNOSIS — E785 Hyperlipidemia, unspecified: Secondary | ICD-10-CM | POA: Insufficient documentation

## 2013-01-08 DIAGNOSIS — E781 Pure hyperglyceridemia: Secondary | ICD-10-CM | POA: Insufficient documentation

## 2013-01-08 DIAGNOSIS — E559 Vitamin D deficiency, unspecified: Secondary | ICD-10-CM | POA: Insufficient documentation

## 2013-01-08 NOTE — Patient Instructions (Addendum)
Follow Weight Watchers diet. Continue lipid-lowering medication. Continue vitamin D supplementation. Return in 6 months for physical examination.

## 2013-01-08 NOTE — Progress Notes (Signed)
  Subjective:    Patient ID: [name], female    DOB: 1955/10/06, 57 y.o.   MRN: [id number]  HPI Patient is now statin medication now for approximately 3 months. In today to discuss results. Marked improvement on statin medication. In January, she had lipid panel done in OB/GYN office. Total cholesterol was 306, triglycerides 208, HDL cholesterol 45, LDL cholesterol 219. Also was found to have vitamin D deficiency with vitamin D level 13.9. Vitamin D level is now within normal limits after replacement therapy. Lipid panel is now within normal limits a low dose statin therapy. Liver functions normal. She's under a fair amount of stress. Her daughter is expecting another child and has a husband who is not very supportive. 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    Review of Systems     Objective:   Physical Exam  skin is warm and dry. Chest clear to auscultation. Cardiac exam regular rate and rhythm. Extremities without edema. Spent 25 minutes speaking with patient about medical issues and situational stress. Talked about diet and exercise.          Assessment & Plan:  Hyperlipidemia-lipid panel now normal on statin therapy. Liver functions are normal.  Vitamin D deficiency-improved with replacement therapy. Start vitamin D 3   2000 units daily after finishing Drisdol  50,000 units weekly for 12 weeks  Situational stress-patient does not want to be on antianxiety medication  Obesity-patient asking about weight loss products but declined to take prescription for [place]  Plan: Patient should return in 6 months for physical examination and labs. Continue statin medication.

## 2013-02-01 ENCOUNTER — Ambulatory Visit (INDEPENDENT_AMBULATORY_CARE_PROVIDER_SITE_OTHER): Payer: BC Managed Care – PPO | Admitting: Internal Medicine

## 2013-02-01 ENCOUNTER — Encounter: Payer: Self-pay | Admitting: Internal Medicine

## 2013-02-01 VITALS — BP 126/84 | Temp 98.1°F | Wt 204.0 lb

## 2013-02-01 DIAGNOSIS — L259 Unspecified contact dermatitis, unspecified cause: Secondary | ICD-10-CM

## 2013-02-01 DIAGNOSIS — L299 Pruritus, unspecified: Secondary | ICD-10-CM

## 2013-02-01 MED ORDER — METHYLPREDNISOLONE ACETATE 80 MG/ML IJ SUSP
80.0000 mg | Freq: Once | INTRAMUSCULAR | Status: AC
  Administered 2013-02-01: 80 mg via INTRAMUSCULAR

## 2013-02-01 NOTE — Progress Notes (Signed)
  Subjective:    Patient ID: [name], female    DOB: 10/08/1955, 57 y.o.   MRN: [id number]  HPI  Patient working out in her yard on May 25 and 26. Feels she encountered poison ivy. She came inside and took a bath and changed her clothes. However today says her face feels very itchy. Her neck is itchy. She is concerned because she had a severe reaction to poison ivy last year which she says left her with some scarring on her upper extremities. Says she kept  arms and legs covered while she was working in the yard.         Review of Systems     Objective:   Physical Exam facial erythema. No bullous lesions or vesicular lesions. Has some scattered erythema on neck. No rash on anterior chest abdomen arms or legs.        Assessment & Plan:  Contact dermatitis  Plan: Depo-Medrol 80 mg IM. Call if symptoms do not resolve or worsen.

## 2013-02-02 NOTE — Patient Instructions (Addendum)
You have been given injection of Depo-Medrol 80 mg IM for contact dermatitis.  Call if symptoms do not resolve or worsen.

## 2013-04-23 DIAGNOSIS — L255 Unspecified contact dermatitis due to plants, except food: Secondary | ICD-10-CM

## 2013-04-23 DIAGNOSIS — T622X1A Toxic effect of other ingested (parts of) plant(s), accidental (unintentional), initial encounter: Secondary | ICD-10-CM

## 2013-04-24 ENCOUNTER — Telehealth: Payer: Self-pay | Admitting: Internal Medicine

## 2013-04-24 NOTE — Telephone Encounter (Signed)
Pt called c/o symptoms of poison ivy affecting face. Does not want to wait for treatment until Monday as worried about symptoms progressing. Called in Sterapred DS 10 mg 6 day dosepack to [facility] [phone].

## 2013-04-25 ENCOUNTER — Other Ambulatory Visit: Payer: Self-pay

## 2013-04-25 MED ORDER — PREDNISONE (PAK) 10 MG PO TABS: 10.0000 mg | ORAL_TABLET | Freq: Every day | ORAL | Status: DC

## 2013-07-14 ENCOUNTER — Other Ambulatory Visit: Payer: Self-pay

## 2013-10-05 ENCOUNTER — Other Ambulatory Visit: Payer: Self-pay

## 2013-10-05 DIAGNOSIS — Z1231 Encounter for screening mammogram for malignant neoplasm of breast: Secondary | ICD-10-CM

## 2013-10-11 ENCOUNTER — Other Ambulatory Visit: Payer: BC Managed Care – PPO | Admitting: Internal Medicine

## 2013-10-11 DIAGNOSIS — E785 Hyperlipidemia, unspecified: Secondary | ICD-10-CM

## 2013-10-11 DIAGNOSIS — Z79899 Other long term (current) drug therapy: Secondary | ICD-10-CM

## 2013-10-11 LAB — LIPID PANEL
CHOL/HDL RATIO: 3.9 ratio
CHOLESTEROL: 213 mg/dL — AB (ref 0–200)
HDL: 55 mg/dL (ref >39)
LDL Cholesterol: 120 mg/dL — ABNORMAL HIGH (ref 0–99)
TRIGLYCERIDES: 192 mg/dL — AB (ref <150)
VLDL: 38 mg/dL (ref 0–40)

## 2013-10-11 LAB — HEPATIC FUNCTION PANEL
ALBUMIN: 4.5 g/dL (ref 3.5–5.2)
ALT: 28 U/L (ref 0–35)
AST: 24 U/L (ref 0–37)
Alkaline Phosphatase: 64 U/L (ref 39–117)
BILIRUBIN DIRECT: 0.1 mg/dL (ref 0.0–0.3)
Indirect Bilirubin: 0.6 mg/dL (ref 0.2–1.2)
TOTAL PROTEIN: 6.9 g/dL (ref 6.0–8.3)
Total Bilirubin: 0.7 mg/dL (ref 0.2–1.2)

## 2013-10-14 ENCOUNTER — Encounter: Payer: Self-pay | Admitting: Internal Medicine

## 2013-10-14 ENCOUNTER — Ambulatory Visit (INDEPENDENT_AMBULATORY_CARE_PROVIDER_SITE_OTHER): Payer: BC Managed Care – PPO | Admitting: Internal Medicine

## 2013-10-14 VITALS — BP 114/76 | HR 88 | Temp 97.3°F | Wt 206.5 lb

## 2013-10-14 DIAGNOSIS — F439 Reaction to severe stress, unspecified: Secondary | ICD-10-CM

## 2013-10-14 DIAGNOSIS — Z733 Stress, not elsewhere classified: Secondary | ICD-10-CM

## 2013-10-14 DIAGNOSIS — E785 Hyperlipidemia, unspecified: Secondary | ICD-10-CM

## 2013-10-14 NOTE — Progress Notes (Signed)
   Subjective:    Patient ID: [name], female    DOB: [birth year], 58 y.o.   MRN: [id number]  HPI In today for six-month recheck of hyperlipidemia. Patient is on Lipitor 10 mg daily. Unfortunately lipid panel was not as good as it was at last visit. She's under a fair amount of stress. Her daughter and 3 grandchildren are living with her at present time. She keeps the baby every week and doesn't have much free time for herself. Has not been able to exercise. Probably eating a bit more than she should.    Review of Systems     Objective:   Physical Exam  Not examined. Lipid panel reviewed. Total cholesterol was 213 and previously was 146. Triglycerides are 192 and previously were 124. LDL cholesterol is 120 and previously was 82. Denies noncompliance with Lipitor.      Assessment & Plan:   Hyperlipidemia  Situational stress  Plan: Spent 20 minutes speaking with patient about these issues. We're going to continue same dose of Lipitor. She'll return in 6 months for physical examination and lab work. Encourage exercise and diet.

## 2013-10-14 NOTE — Patient Instructions (Signed)
Continue Lipitor 10 mg daily. Try the diet exercise. Return in 6 months for physical exam.

## 2013-10-27 ENCOUNTER — Ambulatory Visit
Admission: RE | Admit: 2013-10-27 | Discharge: 2013-10-27 | Disposition: A | Discharge status: Home / Self Care | Payer: BC Managed Care – PPO | Source: Ambulatory Visit

## 2013-10-27 DIAGNOSIS — Z1231 Encounter for screening mammogram for malignant neoplasm of breast: Secondary | ICD-10-CM

## 2013-12-20 ENCOUNTER — Other Ambulatory Visit: Payer: Self-pay | Admitting: Internal Medicine

## 2014-04-18 ENCOUNTER — Other Ambulatory Visit: Payer: BC Managed Care – PPO | Admitting: Internal Medicine

## 2014-04-18 DIAGNOSIS — Z79899 Other long term (current) drug therapy: Secondary | ICD-10-CM

## 2014-04-18 DIAGNOSIS — E785 Hyperlipidemia, unspecified: Secondary | ICD-10-CM

## 2014-04-18 DIAGNOSIS — Z0000 Encounter for general adult medical examination without abnormal findings: Secondary | ICD-10-CM

## 2014-04-18 DIAGNOSIS — E559 Vitamin D deficiency, unspecified: Secondary | ICD-10-CM

## 2014-04-18 LAB — CBC WITH DIFFERENTIAL/PLATELET
BASOS ABS: 0.1 10*3/uL (ref 0.0–0.1)
Basophils Relative: 1 % (ref 0–1)
EOS ABS: 0.1 10*3/uL (ref 0.0–0.7)
Eosinophils Relative: 1 % (ref 0–5)
HCT: 38.9 % (ref 36.0–46.0)
HEMOGLOBIN: 12.7 g/dL (ref 12.0–15.0)
Lymphocytes Relative: 38 % (ref 12–46)
Lymphs Abs: 2.5 10*3/uL (ref 0.7–4.0)
MCH: 28.2 pg (ref 26.0–34.0)
MCHC: 32.6 g/dL (ref 30.0–36.0)
MCV: 86.3 fL (ref 78.0–100.0)
MONO ABS: 0.4 10*3/uL (ref 0.1–1.0)
MONOS PCT: 6 % (ref 3–12)
NEUTROS ABS: 3.5 10*3/uL (ref 1.7–7.7)
NEUTROS PCT: 54 % (ref 43–77)
Platelets: 342 10*3/uL (ref 150–400)
RBC: 4.51 MIL/uL (ref 3.87–5.11)
RDW: 14.2 % (ref 11.5–15.5)
WBC: 6.5 10*3/uL (ref 4.0–10.5)

## 2014-04-18 LAB — LIPID PANEL
CHOL/HDL RATIO: 6.8 ratio
Cholesterol: 277 mg/dL — ABNORMAL HIGH (ref 0–200)
HDL: 41 mg/dL (ref >39)
LDL CALC: 181 mg/dL — AB (ref 0–99)
TRIGLYCERIDES: 273 mg/dL — AB (ref <150)
VLDL: 55 mg/dL — AB (ref 0–40)

## 2014-04-18 LAB — COMPREHENSIVE METABOLIC PANEL
ALT: 25 U/L (ref 0–35)
AST: 24 U/L (ref 0–37)
Albumin: 4.4 g/dL (ref 3.5–5.2)
Alkaline Phosphatase: 70 U/L (ref 39–117)
BILIRUBIN TOTAL: 0.6 mg/dL (ref 0.2–1.2)
BUN: 11 mg/dL (ref 6–23)
CO2: 28 mEq/L (ref 19–32)
Calcium: 9.4 mg/dL (ref 8.4–10.5)
Chloride: 102 mEq/L (ref 96–112)
Creat: 0.79 mg/dL (ref 0.50–1.10)
GLUCOSE: 91 mg/dL (ref 70–99)
POTASSIUM: 4.2 meq/L (ref 3.5–5.3)
SODIUM: 139 meq/L (ref 135–145)
Total Protein: 7 g/dL (ref 6.0–8.3)

## 2014-04-18 LAB — TSH: TSH: 3.13 u[IU]/mL (ref 0.350–4.500)

## 2014-04-19 LAB — VITAMIN D 25 HYDROXY (VIT D DEFICIENCY, FRACTURES): Vit D, 25-Hydroxy: 27 ng/mL — ABNORMAL LOW (ref 30–89)

## 2014-04-20 ENCOUNTER — Encounter: Payer: Self-pay | Admitting: Internal Medicine

## 2014-04-20 ENCOUNTER — Ambulatory Visit (INDEPENDENT_AMBULATORY_CARE_PROVIDER_SITE_OTHER): Payer: BC Managed Care – PPO | Admitting: Internal Medicine

## 2014-04-20 VITALS — BP 122/70 | HR 76 | Ht 65.0 in | Wt 205.0 lb

## 2014-04-20 DIAGNOSIS — Z91030 Bee allergy status: Secondary | ICD-10-CM

## 2014-04-20 DIAGNOSIS — J309 Allergic rhinitis, unspecified: Secondary | ICD-10-CM

## 2014-04-20 DIAGNOSIS — E785 Hyperlipidemia, unspecified: Secondary | ICD-10-CM

## 2014-04-20 DIAGNOSIS — M8580 Other specified disorders of bone density and structure, unspecified site: Secondary | ICD-10-CM

## 2014-04-20 DIAGNOSIS — M949 Disorder of cartilage, unspecified: Secondary | ICD-10-CM

## 2014-04-20 DIAGNOSIS — M899 Disorder of bone, unspecified: Secondary | ICD-10-CM

## 2014-04-20 DIAGNOSIS — K219 Gastro-esophageal reflux disease without esophagitis: Secondary | ICD-10-CM

## 2014-04-20 DIAGNOSIS — E559 Vitamin D deficiency, unspecified: Secondary | ICD-10-CM

## 2014-04-20 DIAGNOSIS — Z91038 Other insect allergy status: Secondary | ICD-10-CM

## 2014-04-20 MED ORDER — ATORVASTATIN CALCIUM 10 MG PO TABS: ORAL_TABLET | ORAL | Status: DC

## 2014-04-20 NOTE — Patient Instructions (Addendum)
Restart Lipitor and return for fasting lipid panel in 2-3 months. Take vitamin D supplementation.

## 2014-06-04 NOTE — Progress Notes (Signed)
   Subjective:    Patient ID: [name], female    DOB: Mar 23, 1956, 58 y.o.   MRN: [id number]  HPI  58 year old white female in today for health maintenance exam and evaluation of medical issues. History of hyperlipidemia, GE reflux, asthma and osteopenia.  Past medical history: History of left lower lobe pneumonia in March 2006. GYN has been flying it [facility]. History of cervical disc disease with bulge C7-T1 diagnosed in 2012. Patient had Pneumovax vaccine in 2006.  Old records indicate patient had total cholesterol of 241 in 2006, triglycerides of 189, LDL cholesterol 146. Statin therapy was suggested at that time but patient wanted to wait.  Patient had a False positive HIV test done at the [facility] in 2005  She is allergic to bee stings.  She has been allergy tested with skin test showing minimal positivity for mold. Spiriva tree showed significant postbronchodilator improvement.  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 Does not smoke or consume alcohol.  Family history: Father's family history is not known. Mother with history of hypertension, hyperlipidemia and cardiac arrhythmia. One brother overweight with alcoholism. Another brother with history of smoking. Another brother whose health is okay. No sisters. She has 2 children- a son and a daughter, both adults.    Review of Systems  Constitutional: Negative for fever, diaphoresis, activity change and appetite change.  HENT: Negative.   Respiratory: Negative.   Cardiovascular: Negative.   Gastrointestinal: Negative.   Genitourinary: Negative.   Neurological: Negative.   Psychiatric/Behavioral: Negative.        Objective:   Physical Exam  Vitals reviewed. Constitutional: She is oriented to person, place, and time. She appears well-developed and well-nourished. No distress.  HENT:  Head: Normocephalic and atraumatic.  Right Ear: External  ear normal.  Left Ear: External ear normal.  Mouth/Throat: Oropharynx is clear and moist. No oropharyngeal exudate.  Eyes: Conjunctivae and EOM are normal. Pupils are equal, round, and reactive to light. Right eye exhibits no discharge. Left eye exhibits no discharge.  Neck: Neck supple. No JVD present. No thyromegaly present.  Cardiovascular: Normal rate, regular rhythm, normal heart sounds and intact distal pulses.   No murmur heard. Pulmonary/Chest: Effort normal. No respiratory distress. She has no wheezes. She has no rales. She exhibits no tenderness.  Breasts normal female  Abdominal: Soft. Bowel sounds are normal. She exhibits no distension and no mass. There is no tenderness. There is no rebound and no guarding.  Genitourinary:  Deferred to GYN  Musculoskeletal: She exhibits no edema.  Lymphadenopathy:    She has no cervical adenopathy.  Neurological: She is alert and oriented to person, place, and time. She has normal reflexes. No cranial nerve deficit. Coordination normal.  Skin: Skin is warm and dry. No rash noted. She is not diaphoretic.  Psychiatric: She has a normal mood and affect. Her behavior is normal. Judgment and thought content normal.          Assessment & Plan:  Hyperlipidemia-patient needs to take statin medication. Prescribed Lipitor 10 mg daily with followup late October with lab only.  History of GE reflux  History of bee sting allergy  Vitamin D deficiency  Plan: Await cholesterol results in October. Recent total cholesterol was 277, triglycerides 273 and LDL cholesterol 181. Commenced her to try low-dose Lipitor. She also has vitamin D deficiency with a level of 27 and these take vitamin D supplement daily

## 2014-07-07 ENCOUNTER — Other Ambulatory Visit: Payer: BC Managed Care – PPO | Admitting: Internal Medicine

## 2014-07-21 ENCOUNTER — Other Ambulatory Visit: Payer: BC Managed Care – PPO | Admitting: Internal Medicine

## 2014-07-21 DIAGNOSIS — E785 Hyperlipidemia, unspecified: Secondary | ICD-10-CM

## 2014-07-21 LAB — LIPID PANEL
Cholesterol: 198 mg/dL (ref 0–200)
HDL: 45 mg/dL (ref >39)
LDL CALC: 111 mg/dL — AB (ref 0–99)
Total CHOL/HDL Ratio: 4.4 Ratio
Triglycerides: 209 mg/dL — ABNORMAL HIGH (ref <150)
VLDL: 42 mg/dL — AB (ref 0–40)

## 2014-08-11 ENCOUNTER — Ambulatory Visit: Payer: BC Managed Care – PPO | Admitting: Internal Medicine

## 2014-08-17 ENCOUNTER — Ambulatory Visit (INDEPENDENT_AMBULATORY_CARE_PROVIDER_SITE_OTHER): Payer: BC Managed Care – PPO | Admitting: Internal Medicine

## 2014-08-17 ENCOUNTER — Encounter: Payer: Self-pay | Admitting: Internal Medicine

## 2014-08-17 VITALS — BP 148/90 | HR 76 | Temp 98.4°F | Wt 209.0 lb

## 2014-08-17 DIAGNOSIS — M79602 Pain in left arm: Secondary | ICD-10-CM

## 2014-08-17 DIAGNOSIS — M7712 Lateral epicondylitis, left elbow: Secondary | ICD-10-CM

## 2014-08-17 MED ORDER — MELOXICAM 15 MG PO TABS: 15.0000 mg | ORAL_TABLET | Freq: Every day | ORAL | Status: DC

## 2014-08-17 NOTE — Patient Instructions (Addendum)
Take Mobic daily. Will get records from Dr. Melven Sartorius office. Refer to PT. Call if not better in 4 weeks

## 2014-08-17 NOTE — Progress Notes (Signed)
   Subjective:    Patient ID: [name], female    DOB: 1955/12/31, 58 y.o.   MRN: [id number]  HPI patient was seen by Dr. Vertell Limber in November 2012. He found that she had significant nerve root compression C7-T1 on the right. She subsequently underwent anterior decompression and fusion C7-T1. However around the time of his evaluation on 07/16/2011 she was complaining of some left shoulder pain. He felt she had impingement syndrome. He recommended a left shoulder injection and physical therapy. However she says pain in her arm improved and she never went to physical therapy. Now has been having pain in her left forearm and elbow for several weeks. She does live on a farm and has been making some grapevine wreaths. 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 She is right-handed.    Review of Systems     Objective:   Physical Exam She has tenderness over her left lateral epicondyles. Deep tendon reflexes normal in the left upper extremity. Muscle strength is normal in the left upper extremity. Some tenderness over left dorsal forearm.       Assessment & Plan:  Probable left lateral epicondylitis  Possible left forearm tendinitis  Plan: Recommend physical therapy. Prescribed Mobitz 15 mg daily. Ice areas down for 15-20 minutes daily. Call if not better in 4 weeks or sooner if worse.

## 2014-11-08 ENCOUNTER — Other Ambulatory Visit: Payer: Self-pay | Admitting: *Deleted

## 2014-11-08 MED ORDER — ATORVASTATIN CALCIUM 10 MG PO TABS: ORAL_TABLET | ORAL | Status: DC

## 2014-11-08 NOTE — Telephone Encounter (Signed)
[facility] needs 6 month follow up appt in May 2016

## 2014-12-14 ENCOUNTER — Encounter: Payer: Self-pay | Admitting: Internal Medicine

## 2014-12-14 ENCOUNTER — Ambulatory Visit (INDEPENDENT_AMBULATORY_CARE_PROVIDER_SITE_OTHER): Payer: BLUE CROSS/BLUE SHIELD | Admitting: Internal Medicine

## 2014-12-14 VITALS — BP 136/86 | HR 93 | Temp 97.9°F | Wt 203.5 lb

## 2014-12-14 DIAGNOSIS — L237 Allergic contact dermatitis due to plants, except food: Secondary | ICD-10-CM | POA: Diagnosis not present

## 2014-12-14 MED ORDER — PROMETHAZINE HCL 25 MG PO TABS
25.0000 mg | ORAL_TABLET | Freq: Three times a day (TID) | ORAL | Status: DC | PRN
Start: 2014-12-14 — End: 2017-04-04

## 2014-12-14 MED ORDER — PREDNISONE 10 MG PO KIT: PACK | ORAL | Status: DC

## 2014-12-14 NOTE — Patient Instructions (Signed)
Take Prednisone as directed. Take phenergan as needed for nausea.

## 2014-12-31 ENCOUNTER — Other Ambulatory Visit: Payer: Self-pay | Admitting: Internal Medicine

## 2014-12-31 NOTE — Telephone Encounter (Signed)
Refill through [facility] due after August 15

## 2015-01-01 ENCOUNTER — Other Ambulatory Visit: Payer: Self-pay | Admitting: *Deleted

## 2015-01-01 MED ORDER — ATORVASTATIN CALCIUM 10 MG PO TABS: ORAL_TABLET | ORAL | Status: DC

## 2015-01-05 ENCOUNTER — Encounter: Payer: Self-pay | Admitting: Internal Medicine

## 2015-01-05 NOTE — Progress Notes (Signed)
   Subjective:    Patient ID: [name], female    DOB: [birth year], 59 y.o.   MRN: [id number]  HPI  Patient was doing some yard work recently and apparently came in contact with poison ivy. She's had this before. His been very itchy and she is quite concerned about it getting worse. Does not involve her face but her extremities. She apparently started on some leftover prednisone recently and may have helped this process    Review of Systems     Objective:   Physical Exam  Lesions consistent with poison ivy noted on her arm. Not very severe at this time      Assessment & Plan:  Contact dermatitis/poison ivy  Plan: Sterapred DS 10 mg 6 day dosepak-take as directed in tapering course 6-5-4-3-2-1 taper  Also asking for refill on Phenergan for nausea if needed which was provided today

## 2015-01-21 ENCOUNTER — Encounter (HOSPITAL_COMMUNITY): Payer: Self-pay

## 2015-01-21 ENCOUNTER — Emergency Department (HOSPITAL_COMMUNITY)
Admission: EM | Admit: 2015-01-21 | Discharge: 2015-01-21 | Disposition: A | Discharge status: Home / Self Care | Payer: BLUE CROSS/BLUE SHIELD | Attending: Emergency Medicine | Admitting: Emergency Medicine

## 2015-01-21 DIAGNOSIS — J029 Acute pharyngitis, unspecified: Secondary | ICD-10-CM | POA: Insufficient documentation

## 2015-01-21 DIAGNOSIS — Z8739 Personal history of other diseases of the musculoskeletal system and connective tissue: Secondary | ICD-10-CM | POA: Insufficient documentation

## 2015-01-21 DIAGNOSIS — J45909 Unspecified asthma, uncomplicated: Secondary | ICD-10-CM | POA: Diagnosis not present

## 2015-01-21 DIAGNOSIS — Z91040 Latex allergy status: Secondary | ICD-10-CM | POA: Insufficient documentation

## 2015-01-21 DIAGNOSIS — E785 Hyperlipidemia, unspecified: Secondary | ICD-10-CM | POA: Insufficient documentation

## 2015-01-21 DIAGNOSIS — Z79899 Other long term (current) drug therapy: Secondary | ICD-10-CM | POA: Diagnosis not present

## 2015-01-21 LAB — RAPID STREP SCREEN (MED CTR MEBANE ONLY): Streptococcus, Group A Screen (Direct): NEGATIVE

## 2015-01-21 NOTE — ED Provider Notes (Signed)
CSN: [id number]     Arrival date & time 01/21/15  2013 History  This chart was scribed for non-physician practitioner, Shari Upstill, PA-C,working with David H Yao, MD, by Jennifer Tensley, ED Scribe. This patient was seen in room WTR5/WTR5 and the patient's care was started at 8:45 PM.  Chief Complaint  Patient presents with  . Sore Throat   The history is provided by the patient and medical records. No language interpreter was used.    HPI Comments:  [name] is a 58 y.o. female who presents to the Emergency Department complaining of unchanged moderate sore throat that began three days ago. She reports associated mild, unproductive cough. She states she has been taking Advil Cold and Sinus with no significant relief of the pain. Swallowing makes the pain worse. Denies alleviating factors. Denies inability to swallow, nausea, vomiting, fever, chills, congestion or rhinorrhea.   Past Medical History  Diagnosis Date  . Osteopenia   . Asthma     NO PROBLEMS IN4/5 YRS  . Hyperlipidemia     NOT ON MEDICATION   Past Surgical History  Procedure Laterality Date  . Anterior cervical decomp/discectomy fusion  09/11/2011    Procedure: ANTERIOR CERVICAL DECOMPRESSION/DISCECTOMY FUSION 1 LEVEL;  Surgeon: Joseph D Stern, MD;  Location: [facility];  Service: Neurosurgery;  Laterality: N/A;  Cervical seven-Thoracic one anterior cervical decompression/discectomy fusion   Family History  Problem Relation Age of Onset  . Hyperlipidemia Mother   . Hypertension Mother   . Alcohol abuse Brother   . Colon polyps Brother   . Colon polyps Father   . Colon cancer Neg Hx   . Stomach cancer Neg Hx    History  Substance Use Topics  . Smoking status: Never Smoker   . Smokeless tobacco: Never Used  . Alcohol Use: No   OB History    No data available     Review of Systems  Constitutional: Negative for fever and chills.  HENT: Positive for sore throat. Negative for congestion, ear pain,  rhinorrhea, trouble swallowing and voice change.   Gastrointestinal: Negative for nausea and vomiting.    Allergies  Latex  Home Medications   Prior to Admission medications   Medication Sig Start Date End Date Taking? Authorizing Provider  atorvastatin (LIPITOR) 10 MG tablet TAKE ONE TABLET BY MOUTH ONE TIME DAILY 01/01/15   Mary J Baxley, MD  cholecalciferol (VITAMIN D) 1000 UNITS tablet Take 1,000 Units by mouth daily.    Historical Provider, MD  ibuprofen (ADVIL,MOTRIN) 200 MG tablet Take 200 mg by mouth every 6 (six) hours as needed.    Historical Provider, MD  Multiple Vitamins-Minerals (MULTIVITAMIN WITH MINERALS) tablet Take 1 tablet by mouth daily.    Historical Provider, MD  PredniSONE 10 MG KIT Take in tapering course as directed. 6-5-4-3-2-1 12/14/14   Mary J Baxley, MD  promethazine (PHENERGAN) 25 MG tablet Take 1 tablet (25 mg total) by mouth every 8 (eight) hours as needed for nausea or vomiting. 12/14/14   Mary J Baxley, MD   Triage Vitals: BP 164/79 mmHg  Pulse 91  Temp(Src) 98.1 F (36.7 C) (Oral)  Resp 18  SpO2 100% Physical Exam  Constitutional: She is oriented to person, place, and time. She appears well-developed and well-nourished.  HENT:  Head: Normocephalic and atraumatic.  Right Ear: Tympanic membrane and ear canal normal.  Left Ear: Tympanic membrane and ear canal normal.  Nose: Nose normal.  Mouth/Throat: Uvula is midline and mucous membranes are   normal. Posterior oropharyngeal erythema present. No oropharyngeal exudate, posterior oropharyngeal edema or tonsillar abscesses.  Eyes: EOM are normal.  Neck: Normal range of motion.  Cardiovascular: Normal rate.   Pulmonary/Chest: Effort normal.  Musculoskeletal: Normal range of motion.  Neurological: She is alert and oriented to person, place, and time.  Skin: Skin is warm and dry.  Psychiatric: She has a normal mood and affect. Her behavior is normal.  Nursing note and vitals reviewed.   ED Course   Procedures (including critical care time) DIAGNOSTIC STUDIES: Oxygen Saturation is 100% on RA, normal by my interpretation.   COORDINATION OF CARE: 8:49 PM- Will order rapid strep test. Pt verbalizes understanding and agrees to plan.  Medications - No data to display  Labs Review Labs Reviewed - No data to display  Imaging Review No results found.   EKG Interpretation None      MDM   Final diagnoses:  None    1. Pharyngitis  Negative Strep test. Patient has uncomplicated viral sore throat requiring supportive care.  I personally performed the services described in this documentation, which was scribed in my presence. The recorded information has been reviewed and is accurate.     Shari Upstill, PA-C 01/25/15 2119  David H Yao, MD 01/27/15 0813 

## 2015-01-21 NOTE — Discharge Instructions (Signed)
Salt Water Gargle This solution will help make your mouth and throat feel better. HOME CARE INSTRUCTIONS   Mix 1 teaspoon of salt in 8 ounces of warm water.  Gargle with this solution as much or often as you need or as directed. Swish and gargle gently if you have any sores or wounds in your mouth.  Do not swallow this mixture. Document Released: 05/29/2004 Document Revised: 11/17/2011 Document Reviewed: 10/20/2008 [facility] Patient Information 2015 [place], [place]. This information is not intended to replace advice given to you by your health care provider. Make sure you discuss any questions you have with your health care provider. Pharyngitis Pharyngitis is redness, pain, and swelling (inflammation) of your pharynx.  CAUSES  Pharyngitis is usually caused by infection. Most of the time, these infections are from viruses (viral) and are part of a cold. However, sometimes pharyngitis is caused by bacteria (bacterial). Pharyngitis can also be caused by allergies. Viral pharyngitis may be spread from person to person by coughing, sneezing, and personal items or utensils (cups, forks, spoons, toothbrushes). Bacterial pharyngitis may be spread from person to person by more intimate contact, such as kissing.  SIGNS AND SYMPTOMS  Symptoms of pharyngitis include:   Sore throat.   Tiredness (fatigue).   Low-grade fever.   Headache.  Joint pain and muscle aches.  Skin rashes.  Swollen lymph nodes.  Plaque-like film on throat or tonsils (often seen with bacterial pharyngitis). DIAGNOSIS  Your health care provider will ask you questions about your illness and your symptoms. Your medical history, along with a physical exam, is often all that is needed to diagnose pharyngitis. Sometimes, a rapid strep test is done. Other lab tests may also be done, depending on the suspected cause.  TREATMENT  Viral pharyngitis will usually get better in 3-4 days without the use of medicine. Bacterial  pharyngitis is treated with medicines that kill germs (antibiotics).  HOME CARE INSTRUCTIONS   Drink enough water and fluids to keep your urine clear or pale yellow.   Only take over-the-counter or prescription medicines as directed by your health care provider:   If you are prescribed antibiotics, make sure you finish them even if you start to feel better.   Do not take aspirin.   Get lots of rest.   Gargle with 8 oz of salt water ( tsp of salt per 1 qt of water) as often as every 1-2 hours to soothe your throat.   Throat lozenges (if you are not at risk for choking) or sprays may be used to soothe your throat. SEEK MEDICAL CARE IF:   You have large, tender lumps in your neck.  You have a rash.  You cough up green, yellow-brown, or bloody spit. SEEK IMMEDIATE MEDICAL CARE IF:   Your neck becomes stiff.  You drool or are unable to swallow liquids.  You vomit or are unable to keep medicines or liquids down.  You have severe pain that does not go away with the use of recommended medicines.  You have trouble breathing (not caused by a stuffy nose). MAKE SURE YOU:   Understand these instructions.  Will watch your condition.  Will get help right away if you are not doing well or get worse. Document Released: 08/25/2005 Document Revised: 06/15/2013 Document Reviewed: 05/02/2013 [facility] Patient Information 2015 [place], [place]. This information is not intended to replace advice given to you by your health care provider. Make sure you discuss any questions you have with your health care provider.

## 2015-01-21 NOTE — ED Notes (Signed)
Patient reports she has had a sore throat several days, which improved with saltwater gargles yesterday.  Denies other symptoms.

## 2015-01-24 LAB — CULTURE, GROUP A STREP: Strep A Culture: NEGATIVE

## 2015-01-30 ENCOUNTER — Ambulatory Visit (INDEPENDENT_AMBULATORY_CARE_PROVIDER_SITE_OTHER): Payer: BLUE CROSS/BLUE SHIELD | Admitting: Internal Medicine

## 2015-01-30 ENCOUNTER — Encounter: Payer: Self-pay | Admitting: Internal Medicine

## 2015-01-30 DIAGNOSIS — J0100 Acute maxillary sinusitis, unspecified: Secondary | ICD-10-CM | POA: Diagnosis not present

## 2015-01-30 MED ORDER — AMOXICILLIN-POT CLAVULANATE 500-125 MG PO TABS: 1.0000 | ORAL_TABLET | Freq: Three times a day (TID) | ORAL | Status: DC

## 2015-01-30 NOTE — Patient Instructions (Addendum)
Take Augmentin 500 mg tid for 10 days. Call if not better in 10 days or sooner if worse.

## 2015-01-30 NOTE — Progress Notes (Signed)
   Subjective:    Patient ID: [name], female    DOB: 31-Oct-1955, 59 y.o.   MRN: [id number]  HPI  she went [facility] emergency department May 15 with respiratory congestion and sore throat. Rapid strep screen was negative. She was not given antibody. Subsequently she has not improved. She sounds nasally congested and has maxillary sinus tenderness. Not much cough. No fever or shaking chills. Has malaise and fatigue.    Review of Systems see above     Objective:   Physical Exam  TMs are slightly full bilaterally but not red. Pharynx very slightly injected without exudate. Sounds very nasally congested when she speaks. Neck is supple without adenopathy. Chest clear to auscultation without rales or wheezing      Assessment & Plan:  Acute sinusitis  Plan: Augmentin 500 mg tid x 10 days.

## 2015-02-24 ENCOUNTER — Other Ambulatory Visit: Payer: Self-pay | Admitting: Internal Medicine

## 2015-04-09 ENCOUNTER — Encounter: Payer: Self-pay | Admitting: Internal Medicine

## 2015-04-24 ENCOUNTER — Other Ambulatory Visit: Payer: BLUE CROSS/BLUE SHIELD | Admitting: Internal Medicine

## 2015-04-24 DIAGNOSIS — Z1329 Encounter for screening for other suspected endocrine disorder: Secondary | ICD-10-CM

## 2015-04-24 DIAGNOSIS — Z1321 Encounter for screening for nutritional disorder: Secondary | ICD-10-CM

## 2015-04-24 DIAGNOSIS — Z0000 Encounter for general adult medical examination without abnormal findings: Secondary | ICD-10-CM

## 2015-04-24 DIAGNOSIS — Z13220 Encounter for screening for lipoid disorders: Secondary | ICD-10-CM

## 2015-04-24 DIAGNOSIS — Z130 Encounter for screening for diseases of the blood and blood-forming organs and certain disorders involving the immune mechanism: Secondary | ICD-10-CM

## 2015-04-24 LAB — COMPLETE METABOLIC PANEL WITH GFR
ALT: 22 U/L (ref 6–29)
AST: 22 U/L (ref 10–35)
Albumin: 3.9 g/dL (ref 3.6–5.1)
Alkaline Phosphatase: 66 U/L (ref 33–130)
BUN: 11 mg/dL (ref 7–25)
CALCIUM: 9.5 mg/dL (ref 8.6–10.4)
CHLORIDE: 105 mmol/L (ref 98–110)
CO2: 29 mmol/L (ref 20–31)
Creat: 0.87 mg/dL (ref 0.50–0.99)
GFR, EST AFRICAN AMERICAN: 84 mL/min (ref >=60)
GFR, EST NON AFRICAN AMERICAN: 73 mL/min (ref >=60)
Glucose, Bld: 83 mg/dL (ref 65–99)
Potassium: 4.2 mmol/L (ref 3.5–5.3)
Sodium: 144 mmol/L (ref 135–146)
Total Bilirubin: 0.7 mg/dL (ref 0.2–1.2)
Total Protein: 6.6 g/dL (ref 6.1–8.1)

## 2015-04-24 LAB — CBC WITH DIFFERENTIAL/PLATELET
BASOS ABS: 0.1 10*3/uL (ref 0.0–0.1)
Basophils Relative: 1 % (ref 0–1)
EOS ABS: 0.2 10*3/uL (ref 0.0–0.7)
EOS PCT: 3 % (ref 0–5)
HCT: 36.7 % (ref 36.0–46.0)
Hemoglobin: 12.4 g/dL (ref 12.0–15.0)
LYMPHS ABS: 2.2 10*3/uL (ref 0.7–4.0)
Lymphocytes Relative: 40 % (ref 12–46)
MCH: 29.5 pg (ref 26.0–34.0)
MCHC: 33.8 g/dL (ref 30.0–36.0)
MCV: 87.2 fL (ref 78.0–100.0)
MONO ABS: 0.4 10*3/uL (ref 0.1–1.0)
MONOS PCT: 7 % (ref 3–12)
MPV: 9.3 fL (ref 8.6–12.4)
Neutro Abs: 2.7 10*3/uL (ref 1.7–7.7)
Neutrophils Relative %: 49 % (ref 43–77)
Platelets: 311 10*3/uL (ref 150–400)
RBC: 4.21 MIL/uL (ref 3.87–5.11)
RDW: 14.4 % (ref 11.5–15.5)
WBC: 5.5 10*3/uL (ref 4.0–10.5)

## 2015-04-24 LAB — LIPID PANEL
Cholesterol: 167 mg/dL (ref 125–200)
HDL: 39 mg/dL — ABNORMAL LOW (ref >=46)
LDL Cholesterol: 88 mg/dL (ref <130)
Total CHOL/HDL Ratio: 4.3 Ratio (ref <=5.0)
Triglycerides: 198 mg/dL — ABNORMAL HIGH (ref <150)
VLDL: 40 mg/dL — AB (ref <30)

## 2015-04-24 LAB — TSH: TSH: 2.898 u[IU]/mL (ref 0.350–4.500)

## 2015-04-25 LAB — VITAMIN D 25 HYDROXY (VIT D DEFICIENCY, FRACTURES): Vit D, 25-Hydroxy: 21 ng/mL — ABNORMAL LOW (ref 30–100)

## 2015-04-26 ENCOUNTER — Ambulatory Visit (INDEPENDENT_AMBULATORY_CARE_PROVIDER_SITE_OTHER): Payer: BLUE CROSS/BLUE SHIELD | Admitting: Internal Medicine

## 2015-04-26 ENCOUNTER — Encounter: Payer: Self-pay | Admitting: Internal Medicine

## 2015-04-26 VITALS — BP 130/82 | HR 85 | Temp 97.9°F | Ht 65.0 in | Wt 204.0 lb

## 2015-04-26 DIAGNOSIS — Z91038 Other insect allergy status: Secondary | ICD-10-CM

## 2015-04-26 DIAGNOSIS — D126 Benign neoplasm of colon, unspecified: Secondary | ICD-10-CM

## 2015-04-26 DIAGNOSIS — J309 Allergic rhinitis, unspecified: Secondary | ICD-10-CM

## 2015-04-26 DIAGNOSIS — Z0000 Encounter for general adult medical examination without abnormal findings: Secondary | ICD-10-CM | POA: Diagnosis not present

## 2015-04-26 DIAGNOSIS — Z23 Encounter for immunization: Secondary | ICD-10-CM

## 2015-04-26 DIAGNOSIS — Z91030 Bee allergy status: Secondary | ICD-10-CM

## 2015-04-26 DIAGNOSIS — E785 Hyperlipidemia, unspecified: Secondary | ICD-10-CM | POA: Diagnosis not present

## 2015-04-26 DIAGNOSIS — K219 Gastro-esophageal reflux disease without esophagitis: Secondary | ICD-10-CM | POA: Diagnosis not present

## 2015-04-26 DIAGNOSIS — M8580 Other specified disorders of bone density and structure, unspecified site: Secondary | ICD-10-CM

## 2015-04-26 DIAGNOSIS — L720 Epidermal cyst: Secondary | ICD-10-CM

## 2015-04-26 DIAGNOSIS — E559 Vitamin D deficiency, unspecified: Secondary | ICD-10-CM

## 2015-04-26 LAB — POCT URINALYSIS DIPSTICK
Bilirubin, UA: NEGATIVE
Blood, UA: NEGATIVE
Glucose, UA: NEGATIVE
Ketones, UA: NEGATIVE
Leukocytes, UA: NEGATIVE
Nitrite, UA: NEGATIVE
PROTEIN UA: NEGATIVE
Spec Grav, UA: >=1.03
UROBILINOGEN UA: NEGATIVE
pH, UA: 5

## 2015-04-26 MED ORDER — ATORVASTATIN CALCIUM 10 MG PO TABS: 10.0000 mg | ORAL_TABLET | Freq: Every day | ORAL | Status: DC

## 2015-05-03 ENCOUNTER — Other Ambulatory Visit: Payer: Self-pay | Admitting: Internal Medicine

## 2015-05-03 DIAGNOSIS — Z1231 Encounter for screening mammogram for malignant neoplasm of breast: Secondary | ICD-10-CM

## 2015-06-22 ENCOUNTER — Encounter: Payer: Self-pay | Admitting: Internal Medicine

## 2015-06-22 ENCOUNTER — Ambulatory Visit (INDEPENDENT_AMBULATORY_CARE_PROVIDER_SITE_OTHER): Payer: BLUE CROSS/BLUE SHIELD | Admitting: Internal Medicine

## 2015-06-22 VITALS — BP 120/78 | HR 91 | Temp 97.4°F | Ht 65.0 in | Wt 204.0 lb

## 2015-06-22 DIAGNOSIS — G5603 Carpal tunnel syndrome, bilateral upper limbs: Secondary | ICD-10-CM

## 2015-06-22 DIAGNOSIS — M79604 Pain in right leg: Secondary | ICD-10-CM | POA: Diagnosis not present

## 2015-06-22 DIAGNOSIS — M545 Low back pain, unspecified: Secondary | ICD-10-CM

## 2015-06-22 DIAGNOSIS — M542 Cervicalgia: Secondary | ICD-10-CM

## 2015-06-22 MED ORDER — MELOXICAM 15 MG PO TABS: 15.0000 mg | ORAL_TABLET | Freq: Every day | ORAL | Status: DC

## 2015-06-22 MED ORDER — CYCLOBENZAPRINE HCL 10 MG PO TABS
ORAL_TABLET | ORAL | Status: DC
Start: 2015-06-22 — End: 2015-08-01

## 2015-06-22 NOTE — Progress Notes (Signed)
   Subjective:    Patient ID: [name], female    DOB: 01-30-1956, 59 y.o.   MRN: [id number]  HPI  Onset hand numbness and tingling x 2 weeks noticeable with ordinary activities carry a food tray or holding a cereal bowl. Does notice it some driving and brushing hair. No weakness in upper extremities.   Also strained back and left neck recently when helping her mother clean her house. Back pain is mostly left-sided. 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 Have her symptoms worsened after doing a lot of deep cleaning for her mother.  She is having some right leg pain without numbness or tingling which was present when she was here in August and we prescribe some Norco  5/325 thinking it was musculoskeletal pain for her which she really didn't take    Review of Systems as above. History of hyperlipidemia.     Objective:   Physical Exam  Constitutional: She is oriented to person, place, and time. She appears well-developed and well-nourished. No distress.  Neck:  Palpable spasm left sternocleidomastoid area  Musculoskeletal: She exhibits no edema.  Straight leg raising is negative at 90 . Muscle strength is normal in both legs.  Deep tendon reflexes 2+ and symmetrical in the upper extremities with normal muscle strength. Positive Phalen's  sign bilaterally  No palpable musculoskeletal pain in lower back  Neurological: She is alert and oriented to person, place, and time. She has normal reflexes. She displays normal reflexes. No cranial nerve deficit.  Skin: She is not diaphoretic.  Vitals reviewed.         Assessment & Plan:  Bilateral carpal tunnel syndrome  Left lower back and left neck pain-musculoskeletal pain  Right leg pain without numbness or tingling  Plan: Patient will see Dr. Fredna Dow regarding bilateral carpal tunnel syndrome. She'll also be sent physical therapy for musculoskeletal pain for back and neck and will follow-up with me 2 weeks after  starting physical therapy. She'll take Mobic 15 mg daily. Have prescribed Flexeril 10 mg one half tablet at bedtime and she also has Norco 5/325 on hand if needed for pain.

## 2015-06-22 NOTE — Patient Instructions (Addendum)
RTC 2-3 take Flexeril at bedtime. Start Mobic 15 mg daily. To see hand surgeon regarding carpal tunnel syndrome. Referred to physical therapy for back and neck pain. You have Norco on hand for pain not relieved with Mobic and Flexeril

## 2015-06-26 NOTE — Addendum Note (Signed)
Addended by: Amado Coe on: 06/26/2015 03:53 PM   Modules accepted: Orders

## 2015-07-03 ENCOUNTER — Ambulatory Visit
Admission: RE | Admit: 2015-07-03 | Discharge: 2015-07-03 | Disposition: A | Discharge status: Home / Self Care | Payer: BLUE CROSS/BLUE SHIELD | Source: Ambulatory Visit | Attending: Internal Medicine | Admitting: Internal Medicine

## 2015-07-03 DIAGNOSIS — Z1231 Encounter for screening mammogram for malignant neoplasm of breast: Secondary | ICD-10-CM

## 2015-07-03 DIAGNOSIS — M8580 Other specified disorders of bone density and structure, unspecified site: Secondary | ICD-10-CM

## 2015-07-30 ENCOUNTER — Ambulatory Visit: Payer: BLUE CROSS/BLUE SHIELD | Admitting: Internal Medicine

## 2015-07-30 ENCOUNTER — Telehealth: Payer: Self-pay | Admitting: Internal Medicine

## 2015-08-01 ENCOUNTER — Encounter (HOSPITAL_BASED_OUTPATIENT_CLINIC_OR_DEPARTMENT_OTHER): Payer: Self-pay | Admitting: *Deleted

## 2015-08-05 NOTE — Progress Notes (Signed)
   Subjective:    Patient ID: [name], female    DOB: November 25, 1955, 59 y.o.   MRN: [id number]  HPI 59 year old White Female in today for health maintenance exam and evaluation of medical issues including hyperlipidemia. She will get influenza vaccine through employment. Triglycerides are elevated. Has not been taking Lipitor recently.  She has a history of osteopenia, GE reflux, asthma.  She is allergic to bee stings. She has been allergy tested with skin tests showing minimal positivity for mold. Spirometry showed significant postbronchodilator improvement.  She had a False Positive HIV test done at [facility] 10/20/200  History of left lower lobe lobe pneumonia March 2006. GYN is  [facility]. History of cervical disc disease with bulge C7-T1 noted in 2012.  Patient had Pneumovax 23 in 2006.   Old records indicate she had total cholesterol of 241 in 2006, triglycerides of 189, LDL cholesterol 146. Statin therapy was suggested at that time but patient wanted to wait. We convinced her to start this in 2015.  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 She is married. Does not smoke or consume alcohol. 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  Family history: Father's family history is not known. Mother with history of hypertension, hyperlipidemia, cardiac arrhythmia. One brother overweight with history of alcoholism. Another brother with history of smoking. Another brother whose health is okay. No sisters. She has 2 children, a son and a daughter, both adults.  Colonoscopy 2014 by Dr. Olevia Perches with adenomatous polyp noted.    Review of Systems  Constitutional: Negative.   Eyes: Negative.   Cardiovascular: Negative.   Genitourinary: Negative.   Neurological: Negative.   Hematological: Negative.   Psychiatric/Behavioral: Negative.        Objective:   Physical Exam  Constitutional: She is oriented  to person, place, and time. She appears well-developed and well-nourished. No distress.  HENT:  Head: Normocephalic and atraumatic.  Right Ear: External ear normal.  Left Ear: External ear normal.  Mouth/Throat: Oropharynx is clear and moist. No oropharyngeal exudate.  Eyes: Conjunctivae and EOM are normal. Pupils are equal, round, and reactive to light. Left eye exhibits no discharge.  Neck: Neck supple. No JVD present. No thyromegaly present.  Cardiovascular: Normal rate, regular rhythm, normal heart sounds and intact distal pulses.   No murmur heard. Pulmonary/Chest: Effort normal and breath sounds normal. She has no wheezes. She has no rales.  Breasts normal female without masses  Abdominal: Bowel sounds are normal. She exhibits no distension. There is no tenderness. There is no guarding.  Genitourinary:  Deferred to GYN  Musculoskeletal: She exhibits no edema.  Lymphadenopathy:    She has no cervical adenopathy.  Neurological: She is alert and oriented to person, place, and time. She has normal reflexes. No cranial nerve deficit. Coordination normal.  Skin: She is not diaphoretic.  Epidermoid cyst upper back  Psychiatric: She has a normal mood and affect. Her behavior is normal. Judgment and thought content normal.  Vitals reviewed.         Assessment & Plan:  Hyperlipidemia-restart Lipitor and follow-up in 6 months  Vitamin D deficiency-take 2000 units vitamin D 3 daily  Bee sting allergy  GE reflux  Epidermoid cyst upper back  Allergic rhinitis  Osteopenia  Asthma-spirometry showed response to bronchodilator therapy when she was allergy tested

## 2015-08-05 NOTE — Patient Instructions (Signed)
Restart Lipitor and return for follow-up in 6 months.

## 2015-08-06 ENCOUNTER — Other Ambulatory Visit: Payer: Self-pay | Admitting: Orthopedic Surgery

## 2015-08-09 ENCOUNTER — Encounter (HOSPITAL_BASED_OUTPATIENT_CLINIC_OR_DEPARTMENT_OTHER)
Admission: RE | Disposition: A | Discharge status: Home / Self Care | Payer: Self-pay | Source: Ambulatory Visit | Attending: Orthopedic Surgery

## 2015-08-09 ENCOUNTER — Ambulatory Visit (HOSPITAL_BASED_OUTPATIENT_CLINIC_OR_DEPARTMENT_OTHER)
Admission: RE | Admit: 2015-08-09 | Discharge: 2015-08-09 | Disposition: A | Discharge status: Home / Self Care | Payer: BLUE CROSS/BLUE SHIELD | Source: Ambulatory Visit | Attending: Orthopedic Surgery | Admitting: Orthopedic Surgery

## 2015-08-09 ENCOUNTER — Ambulatory Visit (HOSPITAL_BASED_OUTPATIENT_CLINIC_OR_DEPARTMENT_OTHER): Payer: BLUE CROSS/BLUE SHIELD | Admitting: Anesthesiology

## 2015-08-09 ENCOUNTER — Encounter (HOSPITAL_BASED_OUTPATIENT_CLINIC_OR_DEPARTMENT_OTHER): Payer: Self-pay | Admitting: Orthopedic Surgery

## 2015-08-09 DIAGNOSIS — J45909 Unspecified asthma, uncomplicated: Secondary | ICD-10-CM | POA: Diagnosis not present

## 2015-08-09 DIAGNOSIS — G5603 Carpal tunnel syndrome, bilateral upper limbs: Secondary | ICD-10-CM | POA: Insufficient documentation

## 2015-08-09 DIAGNOSIS — Z79899 Other long term (current) drug therapy: Secondary | ICD-10-CM | POA: Diagnosis not present

## 2015-08-09 DIAGNOSIS — E785 Hyperlipidemia, unspecified: Secondary | ICD-10-CM | POA: Diagnosis not present

## 2015-08-09 DIAGNOSIS — Z91040 Latex allergy status: Secondary | ICD-10-CM | POA: Diagnosis not present

## 2015-08-09 HISTORY — PX: BILATERAL CARPAL TUNNEL RELEASE: SHX6508

## 2015-08-09 SURGERY — BILATERAL CARPAL TUNNEL RELEASE
Anesthesia: General | Site: Wrist | Laterality: Bilateral

## 2015-08-09 MED ORDER — LACTATED RINGERS IV SOLN
INTRAVENOUS | Status: DC
  Administered 2015-08-09 (×2): via INTRAVENOUS

## 2015-08-09 MED ORDER — OXYCODONE HCL 5 MG PO TABS: 5.0000 mg | ORAL_TABLET | Freq: Once | ORAL | Status: DC | PRN

## 2015-08-09 MED ORDER — ONDANSETRON HCL 4 MG/2ML IJ SOLN
INTRAMUSCULAR | Status: AC
  Filled 2015-08-09: qty 2

## 2015-08-09 MED ORDER — KETOROLAC TROMETHAMINE 30 MG/ML IJ SOLN: 30.0000 mg | Freq: Once | INTRAMUSCULAR | Status: DC

## 2015-08-09 MED ORDER — HYDROCODONE-ACETAMINOPHEN 5-325 MG PO TABS: 1.0000 | ORAL_TABLET | Freq: Four times a day (QID) | ORAL | Status: DC | PRN

## 2015-08-09 MED ORDER — PROPOFOL 500 MG/50ML IV EMUL
INTRAVENOUS | Status: AC
  Filled 2015-08-09: qty 50

## 2015-08-09 MED ORDER — SCOPOLAMINE 1 MG/3DAYS TD PT72: 1.0000 | MEDICATED_PATCH | Freq: Once | TRANSDERMAL | Status: DC | PRN

## 2015-08-09 MED ORDER — CHLORHEXIDINE GLUCONATE 4 % EX LIQD: 60.0000 mL | Freq: Once | CUTANEOUS | Status: DC

## 2015-08-09 MED ORDER — CEFAZOLIN SODIUM-DEXTROSE 2-3 GM-% IV SOLR
INTRAVENOUS | Status: AC
  Filled 2015-08-09: qty 50

## 2015-08-09 MED ORDER — MIDAZOLAM HCL 2 MG/2ML IJ SOLN
INTRAMUSCULAR | Status: AC
  Filled 2015-08-09: qty 2

## 2015-08-09 MED ORDER — DEXAMETHASONE SODIUM PHOSPHATE 10 MG/ML IJ SOLN
INTRAMUSCULAR | Status: AC
  Filled 2015-08-09: qty 1

## 2015-08-09 MED ORDER — HYDROMORPHONE HCL 1 MG/ML IJ SOLN: 0.2500 mg | INTRAMUSCULAR | Status: DC | PRN

## 2015-08-09 MED ORDER — BUPIVACAINE HCL (PF) 0.25 % IJ SOLN
INTRAMUSCULAR | Status: DC | PRN
  Administered 2015-08-09: 5 mL

## 2015-08-09 MED ORDER — DEXAMETHASONE SODIUM PHOSPHATE 10 MG/ML IJ SOLN
INTRAMUSCULAR | Status: DC | PRN
  Administered 2015-08-09: 10 mg via INTRAVENOUS

## 2015-08-09 MED ORDER — LIDOCAINE HCL (CARDIAC) 20 MG/ML IV SOLN
INTRAVENOUS | Status: AC
  Filled 2015-08-09: qty 5

## 2015-08-09 MED ORDER — SUCCINYLCHOLINE CHLORIDE 20 MG/ML IJ SOLN
INTRAMUSCULAR | Status: AC
  Filled 2015-08-09: qty 1

## 2015-08-09 MED ORDER — OXYCODONE HCL 5 MG/5ML PO SOLN: 5.0000 mg | Freq: Once | ORAL | Status: DC | PRN

## 2015-08-09 MED ORDER — MIDAZOLAM HCL 2 MG/2ML IJ SOLN
1.0000 mg | INTRAMUSCULAR | Status: DC | PRN
  Administered 2015-08-09: 2 mg via INTRAVENOUS

## 2015-08-09 MED ORDER — EPHEDRINE SULFATE 50 MG/ML IJ SOLN
INTRAMUSCULAR | Status: AC
  Filled 2015-08-09: qty 1

## 2015-08-09 MED ORDER — PROPOFOL 10 MG/ML IV BOLUS
INTRAVENOUS | Status: DC | PRN
  Administered 2015-08-09: 200 mg via INTRAVENOUS

## 2015-08-09 MED ORDER — FENTANYL CITRATE (PF) 100 MCG/2ML IJ SOLN
50.0000 ug | INTRAMUSCULAR | Status: DC | PRN
  Administered 2015-08-09 (×2): 50 ug via INTRAVENOUS

## 2015-08-09 MED ORDER — PROMETHAZINE HCL 25 MG/ML IJ SOLN: 6.2500 mg | INTRAMUSCULAR | Status: DC | PRN

## 2015-08-09 MED ORDER — FENTANYL CITRATE (PF) 100 MCG/2ML IJ SOLN
INTRAMUSCULAR | Status: AC
  Filled 2015-08-09: qty 2

## 2015-08-09 MED ORDER — CEFAZOLIN SODIUM-DEXTROSE 2-3 GM-% IV SOLR: 2.0000 g | INTRAVENOUS | Status: DC

## 2015-08-09 MED ORDER — LIDOCAINE HCL (CARDIAC) 20 MG/ML IV SOLN
INTRAVENOUS | Status: DC | PRN
  Administered 2015-08-09: 25 mg via INTRAVENOUS

## 2015-08-09 MED ORDER — LIDOCAINE HCL (PF) 1 % IJ SOLN
INTRAMUSCULAR | Status: DC | PRN
  Administered 2015-08-09: 5 mL

## 2015-08-09 MED ORDER — CEFAZOLIN SODIUM-DEXTROSE 2-3 GM-% IV SOLR
2.0000 g | INTRAVENOUS | Status: AC
  Administered 2015-08-09: 2 g via INTRAVENOUS

## 2015-08-09 MED ORDER — GLYCOPYRROLATE 0.2 MG/ML IJ SOLN: 0.2000 mg | Freq: Once | INTRAMUSCULAR | Status: DC | PRN

## 2015-08-09 SURGICAL SUPPLY — 42 items
BLADE SURG 15 STRL LF DISP TIS (BLADE) ×2 IMPLANT
BLADE SURG 15 STRL SS (BLADE) ×2
BNDG COHESIVE 3X5 TAN STRL LF (GAUZE/BANDAGES/DRESSINGS) ×4 IMPLANT
BNDG ESMARK 4X9 LF (GAUZE/BANDAGES/DRESSINGS) IMPLANT
BNDG GAUZE ELAST 4 BULKY (GAUZE/BANDAGES/DRESSINGS) ×4 IMPLANT
CHLORAPREP W/TINT 26ML (MISCELLANEOUS) ×4 IMPLANT
CORDS BIPOLAR (ELECTRODE) ×2 IMPLANT
COVER BACK TABLE 60X90IN (DRAPES) ×2 IMPLANT
COVER MAYO STAND STRL (DRAPES) ×4 IMPLANT
CUFF TOURNIQUET SINGLE 18IN (TOURNIQUET CUFF) ×4 IMPLANT
DERMABOND ADVANCED (GAUZE/BANDAGES/DRESSINGS) ×2
DERMABOND ADVANCED .7 DNX12 (GAUZE/BANDAGES/DRESSINGS) ×2 IMPLANT
DRAPE EXTREMITY T 121X128X90 (DRAPE) ×4 IMPLANT
DRAPE SURG 17X23 STRL (DRAPES) ×4 IMPLANT
DRSG PAD ABDOMINAL 8X10 ST (GAUZE/BANDAGES/DRESSINGS) ×2 IMPLANT
GAUZE SPONGE 4X4 12PLY STRL (GAUZE/BANDAGES/DRESSINGS) ×4 IMPLANT
GAUZE XEROFORM 1X8 LF (GAUZE/BANDAGES/DRESSINGS) ×2 IMPLANT
GLOVE BIO SURGEON STRL SZ 6.5 (GLOVE) ×2 IMPLANT
GLOVE BIOGEL PI IND STRL 7.0 (GLOVE) ×1 IMPLANT
GLOVE BIOGEL PI IND STRL 8 (GLOVE) ×1 IMPLANT
GLOVE BIOGEL PI IND STRL 8.5 (GLOVE) ×1 IMPLANT
GLOVE BIOGEL PI INDICATOR 7.0 (GLOVE) ×1
GLOVE BIOGEL PI INDICATOR 8 (GLOVE) ×1
GLOVE BIOGEL PI INDICATOR 8.5 (GLOVE) ×1
GLOVE SURG ORTHO 8.0 STRL STRW (GLOVE) ×2 IMPLANT
GLOVE SURG SS PI 8.0 STRL IVOR (GLOVE) ×2 IMPLANT
GOWN STRL REUS W/ TWL LRG LVL3 (GOWN DISPOSABLE) ×2 IMPLANT
GOWN STRL REUS W/TWL LRG LVL3 (GOWN DISPOSABLE) ×2
GOWN STRL REUS W/TWL XL LVL3 (GOWN DISPOSABLE) ×2 IMPLANT
NEEDLE PRECISIONGLIDE 27X1.5 (NEEDLE) ×2 IMPLANT
NS IRRIG 1000ML POUR BTL (IV SOLUTION) ×2 IMPLANT
PACK BASIN DAY SURGERY FS (CUSTOM PROCEDURE TRAY) ×2 IMPLANT
PAD ALCOHOL SWAB (MISCELLANEOUS) ×4 IMPLANT
PADDING CAST ABS 4INX4YD NS (CAST SUPPLIES) ×1
PADDING CAST ABS COTTON 4X4 ST (CAST SUPPLIES) ×1 IMPLANT
STOCKINETTE 4X48 STRL (DRAPES) ×4 IMPLANT
SUT MON AB 5-0 P3 18 (SUTURE) ×4 IMPLANT
SUT VICRYL 4-0 PS2 18IN ABS (SUTURE) IMPLANT
SYR BULB 3OZ (MISCELLANEOUS) ×2 IMPLANT
SYR CONTROL 10ML LL (SYRINGE) IMPLANT
TOWEL OR 17X24 6PK STRL BLUE (TOWEL DISPOSABLE) ×2 IMPLANT
UNDERPAD 30X30 (UNDERPADS AND DIAPERS) ×4 IMPLANT

## 2015-08-09 NOTE — Anesthesia Procedure Notes (Signed)
Procedure Name: LMA Insertion Date/Time: 08/09/2015 8:43 AM Performed by: Melynda Ripple D Pre-anesthesia Checklist: Patient identified, Emergency Drugs available, Suction available and Patient being monitored Patient Re-evaluated:Patient Re-evaluated prior to inductionOxygen Delivery Method: Circle System Utilized Preoxygenation: Pre-oxygenation with 100% oxygen Intubation Type: IV induction Ventilation: Mask ventilation without difficulty LMA: LMA inserted LMA Size: 4.0 Number of attempts: 1 Airway Equipment and Method: Bite block Placement Confirmation: positive ETCO2 Tube secured with: Tape Dental Injury: Teeth and Oropharynx as per pre-operative assessment

## 2015-08-09 NOTE — H&P (Signed)
[name] is a 59 year old right hand dominant female with  numbness and tingling bilateral hands that has been going on since September. She recalls no specific history of injury other than amount of work. She is not awakened at night. She has had fusions done by Dr. Vertell Horn. There is no history of diabetes, thyroid problems, arthritis or gout. There is a family history of diabetes. She states talking on the phone, driving and reading also increases her symptoms for her. She was recently placed on Mobic and Flexeril which she has not begun taking and as such has not seen any significant relief. She states the numbness and tingling is relatively constant. She states it occurs with heavy lifting. She complains of constant mild numb feeling and states it is getting worse. Activity, exercise and work makes this worse, rest makes it better. She has had her nerve conductions done by Dr. Zebedee Horn revealing bilateral carpal tunnel syndrome with a motor delay of 5.3 on the left, 5.9 on the right, sensory delay of 3.3 on the left and 3.3 on the right, amplitude diminution is 15 on the left, 19.7 on the right. She had little relief to the injection on the left side.  She is wondering about proceeding to have the surgical release performed rather than injecting the right side which we have offered her.    PAST MEDICAL HISTORY:  She has no drug allergies. She is on Lipitor and Advil which she has stopped. She takes Meloxicam and Flexeril. She has had cervical discectomy in 2013.   FAMILY MEDICAL HISTORY: Positive for diabetes and high BP.  SOCIAL HISTORY:  She does not smoke or use alcohol. 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  REVIEW OF SYSTEMS: Positive for glasses otherwise negative 14 points.  [name] is an 59 y.o. female.   Chief Complaint: Bilateral carpal tunnel syndrome HPI: see above  Past Medical History  Diagnosis Date  . Osteopenia   . Asthma     NO PROBLEMS IN4/5 YRS   . Hyperlipidemia     NOT ON MEDICATION    Past Surgical History  Procedure Laterality Date  . Anterior cervical decomp/discectomy fusion  09/11/2011    Procedure: ANTERIOR CERVICAL DECOMPRESSION/DISCECTOMY FUSION 1 LEVEL;  Surgeon: Lori Shoals, MD;  Location: [facility];  Service: Neurosurgery;  Laterality: N/A;  Cervical seven-Thoracic one anterior cervical decompression/discectomy fusion    Family History  Problem Relation Age of Onset  . Hyperlipidemia Mother   . Hypertension Mother   . Alcohol abuse Brother   . Colon polyps Brother   . Colon polyps Father   . Colon cancer Neg Hx   . Stomach cancer Neg Hx    Social History:  reports that she has never smoked. She has never used smokeless tobacco. She reports that she does not drink alcohol or use illicit drugs.  Allergies:  Allergies  Allergen Reactions  . Latex Other (See Comments)    Patient states that it just bothers her skin a little bit.    Medications Prior to Admission  Medication Sig Dispense Refill  . acetaminophen (TYLENOL) 500 MG tablet Take 1,000 mg by mouth every 6 (six) hours as needed (for headaches).    Marland Kitchen atorvastatin (LIPITOR) 10 MG tablet Take 1 tablet (10 mg total) by mouth daily at 6 PM. 90 tablet 3  . ibuprofen (ADVIL,MOTRIN) 200 MG tablet Take 400 mg by mouth every 6 (six) hours as needed (for back spasms).     Marland Kitchen  promethazine (PHENERGAN) 25 MG tablet Take 1 tablet (25 mg total) by mouth every 8 (eight) hours as needed for nausea or vomiting. 20 tablet 1  . cyclobenzaprine (FLEXERIL) 10 MG tablet     . meloxicam (MOBIC) 15 MG tablet       No results found for this or any previous visit (from the past 48 hour(s)).  No results found.   Pertinent items are noted in HPI.  Height 5\' 5"  (1.651 m), weight 92.534 kg (204 lb).  General appearance: alert, cooperative and appears stated age Head: Normocephalic, without obvious abnormality Neck: no JVD Resp: clear to auscultation  bilaterally Cardio: regular rate and rhythm, S1, S2 normal, no murmur, click, rub or gallop GI: soft, non-tender; bowel sounds normal; no masses,  no organomegaly Extremities: numbness and tingling both hands Pulses: 2+ and symmetric Skin: Skin color, texture, turgor normal. No rashes or lesions Neurologic: Grossly normal Incision/Wound: na  Assessment/Plan X-rays of her hands are negative.  X-rays of her cervical spine reveals a fusion at C6/7 with arthritic changes at C5/6, 4/5, 3/4  DIAGNOSIS: (1) Cervical spondylosis. (2)  carpal tunnel syndrome.  PLAN:  She would like to proceed with release of the carpal tunnels and would like to have both sides done at the same time.  The pre, peri, postoperative courses were discussed along with the risks and complications.   She is aware there is no guarantee with the surgery, possibility of infection, recurrence, injury to arteries, nerves, tendons, incomplete relief of symptoms and dystrophy.  She would like to proceed. She is scheduled for carpal tunnel release both right and left hands as an outpatient under regional anesthesia.  [name] 08/09/2015, 7:36 AM

## 2015-08-09 NOTE — Anesthesia Preprocedure Evaluation (Addendum)
Anesthesia Evaluation  Patient identified by MRN, date of birth, ID band Patient awake    Reviewed: Allergy & Precautions, NPO status , Patient's Chart, lab work & pertinent test results  Airway Mallampati: II  TM Distance: >3 FB     Dental   Pulmonary neg pulmonary ROS,    breath sounds clear to auscultation       Cardiovascular negative cardio ROS   Rhythm:Regular Rate:Normal     Neuro/Psych negative neurological ROS     GI/Hepatic Neg liver ROS, GERD  ,  Endo/Other  Morbid obesity  Renal/GU negative Renal ROS     Musculoskeletal   Abdominal   Peds  Hematology negative hematology ROS (+)   Anesthesia Other Findings   Reproductive/Obstetrics                            Anesthesia Physical Anesthesia Plan  ASA: II  Anesthesia Plan: General   Post-op Pain Management:    Induction: Intravenous  Airway Management Planned: LMA  Additional Equipment:   Intra-op Plan:   Post-operative Plan:   Informed Consent: I have reviewed the patients History and Physical, chart, labs and discussed the procedure including the risks, benefits and alternatives for the proposed anesthesia with the patient or authorized representative who has indicated his/her understanding and acceptance.   Dental advisory given  Plan Discussed with: CRNA  Anesthesia Plan Comments:        Anesthesia Quick Evaluation

## 2015-08-09 NOTE — Brief Op Note (Signed)
08/09/2015  9:48 AM  PATIENT:  Stevan Born  59 y.o. female  PRE-OPERATIVE DIAGNOSIS:  BILATERAL CARPAL TUNNEL  POST-OPERATIVE DIAGNOSIS:  BILATERAL CARPAL TUNNEL  PROCEDURE:  Procedure(s): BILATERAL CARPAL TUNNEL RELEASE (Bilateral)  SURGEON:  Surgeon(s) and Role:    * Daryll Brod, MD - Primary  PHYSICIAN ASSISTANT:   ASSISTANTS: none   ANESTHESIA:   local and general  EBL:  Total I/O In: 1000 [I.V.:1000] Out: -   BLOOD ADMINISTERED:none  DRAINS: none   LOCAL MEDICATIONS USED:  BUPIVICAINE   SPECIMEN:  No Specimen  DISPOSITION OF SPECIMEN:  N/A  COUNTS:  YES  TOURNIQUET:   Total Tourniquet Time Documented: Upper Arm (Right) - 19 minutes Upper Arm (Right) - 27 minutes Total: Upper Arm (Right) - 46 minutes   DICTATION: .Other Dictation: Dictation Number [id number]  PLAN OF CARE: Discharge to home after PACU  PATIENT DISPOSITION:  PACU - hemodynamically stable.

## 2015-08-09 NOTE — Anesthesia Postprocedure Evaluation (Signed)
Anesthesia Post Note  Patient: [name]  Procedure(s) Performed: Procedure(s) (LRB): BILATERAL CARPAL TUNNEL RELEASE (Bilateral)  Patient location during evaluation: PACU Anesthesia Type: General Level of consciousness: awake and alert Pain management: pain level controlled Vital Signs Assessment: post-procedure vital signs reviewed and stable Respiratory status: spontaneous breathing Cardiovascular status: blood pressure returned to baseline Postop Assessment: no signs of nausea or vomiting Anesthetic complications: no    Last Vitals:  Filed Vitals:   08/09/15 1030 08/09/15 1103  BP: 157/94 123/89  Pulse: 75 85  Temp:  36.4 C  Resp: 15 16    Last Pain:  Filed Vitals:   08/09/15 1104  PainSc: 0-No pain                 Tiajuana Amass

## 2015-08-09 NOTE — Discharge Instructions (Addendum)
[facility] Instructions Hand Surgery  Wound Care: Keep your hand elevated above the level of your heart.  Do not allow it to dangle by your side.  Keep the dressing dry and do not remove it unless your doctor advises you to do so.  He will usually change it at the time of your post-op visit.  Moving your fingers is advised to stimulate circulation but will depend on the site of your surgery.  If you have a splint applied, your doctor will advise you regarding movement.  Activity: Do not drive or operate machinery today.  Rest today and then you may return to your normal activity and work as indicated by your physician.  Diet:  Drink liquids today or eat a light diet.  You may resume a regular diet tomorrow.    General expectations: Pain for two to three days. Fingers may become slightly swollen.  Call your doctor if any of the following occur: Severe pain not relieved by pain medication. Elevated temperature. Dressing soaked with blood. Inability to move fingers. White or bluish color to fingers  Patient may remove dressings tomorrow and apply Nexcare waterproof bandaids.   Post Anesthesia Home Care Instructions  Activity: Get plenty of rest for the remainder of the day. A responsible adult should stay with you for 24 hours following the procedure.  For the next 24 hours, DO NOT: -Drive a car -Paediatric nurse -Drink alcoholic beverages -Take any medication unless instructed by your physician -Make any legal decisions or sign important papers.  Meals: Start with liquid foods such as gelatin or soup. Progress to regular foods as tolerated. Avoid greasy, spicy, heavy foods. If nausea and/or vomiting occur, drink only clear liquids until the nausea and/or vomiting subsides. Call your physician if vomiting continues.  Special Instructions/Symptoms: Your throat may feel dry or sore from the anesthesia or the breathing tube placed in your throat during surgery. If this causes  discomfort, gargle with warm salt water. The discomfort should disappear within 24 hours.  If you had a scopolamine patch placed behind your ear for the management of post- operative nausea and/or vomiting:  1. The medication in the patch is effective for 72 hours, after which it should be removed.  Wrap patch in a tissue and discard in the trash. Wash hands thoroughly with soap and water. 2. You may remove the patch earlier than 72 hours if you experience unpleasant side effects which may include dry mouth, dizziness or visual disturbances. 3. Avoid touching the patch. Wash your hands with soap and water after contact with the patch.    Post Anesthesia Home Care Instructions  Activity: Get plenty of rest for the remainder of the day. A responsible adult should stay with you for 24 hours following the procedure.  For the next 24 hours, DO NOT: -Drive a car -Paediatric nurse -Drink alcoholic beverages -Take any medication unless instructed by your physician -Make any legal decisions or sign important papers.  Meals: Start with liquid foods such as gelatin or soup. Progress to regular foods as tolerated. Avoid greasy, spicy, heavy foods. If nausea and/or vomiting occur, drink only clear liquids until the nausea and/or vomiting subsides. Call your physician if vomiting continues.  Special Instructions/Symptoms: Your throat may feel dry or sore from the anesthesia or the breathing tube placed in your throat during surgery. If this causes discomfort, gargle with warm salt water. The discomfort should disappear within 24 hours.  If you had a scopolamine patch placed behind your  ear for the management of post- operative nausea and/or vomiting:  1. The medication in the patch is effective for 72 hours, after which it should be removed.  Wrap patch in a tissue and discard in the trash. Wash hands thoroughly with soap and water. 2. You may remove the patch earlier than 72 hours if you experience  unpleasant side effects which may include dry mouth, dizziness or visual disturbances. 3. Avoid touching the patch. Wash your hands with soap and water after contact with the patch.

## 2015-08-09 NOTE — Transfer of Care (Signed)
Immediate Anesthesia Transfer of Care Note  Patient: [name]  Procedure(s) Performed: Procedure(s): BILATERAL CARPAL TUNNEL RELEASE (Bilateral)  Patient Location: PACU  Anesthesia Type:General  Level of Consciousness: awake, alert  and oriented  Airway & Oxygen Therapy: Patient Spontanous Breathing and Patient connected to face mask oxygen  Post-op Assessment: Report given to RN and Post -op Vital signs reviewed and stable  Post vital signs: Reviewed and stable  Last Vitals:  Filed Vitals:   08/09/15 0751  BP: 147/79  Pulse: 86  Temp: 36.8 C  Resp: 20    Complications: No apparent anesthesia complications

## 2015-08-09 NOTE — Op Note (Signed)
Dictation Number [id number]

## 2015-08-10 ENCOUNTER — Encounter (HOSPITAL_BASED_OUTPATIENT_CLINIC_OR_DEPARTMENT_OTHER): Payer: Self-pay | Admitting: Orthopedic Surgery

## 2015-08-10 NOTE — Op Note (Signed)
[name], [name]             ACCOUNT NO.:  [id number]  MEDICAL RECORD NO.:  [id number]  LOCATION:                               FACILITY:  [facility]  PHYSICIAN:  [name], M.D.       DATE OF BIRTH:  May 07, 1956  DATE OF PROCEDURE:  08/09/2015 DATE OF DISCHARGE:  08/09/2015                              OPERATIVE REPORT   PREOPERATIVE DIAGNOSIS:  Bilateral carpal tunnel syndrome.  POSTOPERATIVE DIAGNOSIS:  Bilateral carpal tunnel syndrome.  OPERATION: 1. Decompression, median nerve, right wrist. 2. Decompression, median nerve, left wrist.  SURGEON:  [name], M.D.  ANESTHESIA:  General with local infiltration.  ANESTHESIOLOGIST:  [name].  HISTORY:  The patient is a 59 year old female with a history of bilateral carpal tunnel syndrome.  EMG and nerve conduction is positive. She has not responded to conservative treatment including injections, has opted to have bilateral carpal tunnel releases rather than unilateral separated by time.  She is aware that there is no guarantee with the surgery, possibility of infection; recurrence of injury to arteries, nerves, tendons; incomplete relief of symptoms; dystrophy.  In the preoperative area, the patient is seen.  The extremity marked by both the patient and surgeon.  Antibiotic given.  PROCEDURE IN DETAIL:  The patient was brought to the operating room, where a general anesthetic was carried out without difficulty, under the direction of Dr. Tobias Horn.  She was prepped using ChloraPrep, supine position with the right arm free.  A time-out taken confirming the patient procedure.  A 3-minute dry time allowed, after ChloraPrep prep. The limb was then exsanguinated with an Esmarch bandage.  Tourniquet placed on the upper arm was inflated to 250 mmHg.  A longitudinal incision was then made in the right palm, carried down through subcutaneous tissue.  Bleeders were electrocauterized with bipolar.  The palmar fascia was split.  The  superficial palmar arch was identical distally.  The flexor tendon to the ring finger identified, retractors placed protecting the median nerve radially and ulnar nerve ulnarly.  An incision was then made through the carpal retinaculum with sharp dissection.  A right-angle and Sewall retractor were placed between skin and forearm fascia, after blunt dissection with blunt scissors, separating the nerve from the overlying fascia in the subcutaneous tissue from the deep fascia.  Scissors were then used to cut the distal forearm fascia for approximately 2 cm proximal to the wrist crease under direct vision.  Canal was explored.  Air compression to the nerve was apparent.  Motor branch entered into muscle centrally, was found to be totally intact.  The wound was copiously irrigated with saline.  The skin was then closed with a subcuticular 4-0 Monocryl suture.  This was dried with alcohol.  A local infiltration with 0.25% bupivacaine without epinephrine was given, 5 mL was used.  After alcohol swab and drying, Dermabond was used to seal the wound.  A sterile compressive dressing with the fingers free was applied.  On deflation of the tourniquet, all fingers immediately pinked.  The left arm was then prepped and draped using ChloraPrep.  A 3 minute dry time was allowed.  Time-out taken, confirming the patient and procedure.  The limb  was exsanguinated with an Esmarch bandage.  Tourniquet placed on the forearm was inflated to 250 mmHg.  A longitudinal incision was made in the left palm, carried down through subcutaneous tissue.  Bleeders again electrocauterized with bipolar.  Palmar fascia was split.  The superficial palmar arch was identified.  The flexor tendon to the ring and little finger identified. Retractors placed protecting the median nerve radially, ulnar noted ulnarly.  The flexor retinaculum was then incised with sharp dissection. The right angle and Sewall retractor were placed  between skin and forearm fascia.  The nerve dissected bluntly from the overlying fascia proximally with blunt scissors and these were then used to cut the distal forearm fascia for approximately 2.5 cm to 3 cm proximal to the wrist crease under direct vision.  Canal was explored again.  The area compression to the nerve was apparent.  The motor branch entered on the more radial aspect on her left side, was found to be intact.  The wound was copiously irrigated with saline.  The skin was then closed with a subcuticular 4-0 Monocryl suture.  A local infiltration with 0.25% bupivacaine was given, 5 mL was used on the left side.  The wound was cleaned with alcohol and then Dermabond.  A sterile compressive dressing with the fingers free was applied.  On deflation of the tourniquet, the fingers on the left hand immediately pinked.  She was taken to the recovery room for observation in satisfactory condition.  She will be discharged home to return the [place] in 1 week on Vicodin.          ______________________________ [name], M.D.     GK/MEDQ  D:  08/09/2015  T:  08/10/2015  Job:  [id number]

## 2016-05-12 ENCOUNTER — Encounter: Payer: Self-pay | Admitting: Internal Medicine

## 2016-05-12 ENCOUNTER — Telehealth: Payer: Self-pay | Admitting: Internal Medicine

## 2016-05-12 DIAGNOSIS — Z029 Encounter for administrative examinations, unspecified: Secondary | ICD-10-CM

## 2016-05-12 MED ORDER — PREDNISONE 10 MG PO TABS: ORAL_TABLET | ORAL | 0 refills | Status: DC

## 2016-05-12 NOTE — Telephone Encounter (Signed)
Pt. Called c/o poison ivy symptoms onset around noon today. Has strong reaction to poison ivy. Call in Sterapred DS 10 mg 6 day dosepack to [facility]

## 2016-05-20 ENCOUNTER — Ambulatory Visit (INDEPENDENT_AMBULATORY_CARE_PROVIDER_SITE_OTHER): Payer: BLUE CROSS/BLUE SHIELD | Admitting: Internal Medicine

## 2016-05-20 ENCOUNTER — Encounter: Payer: Self-pay | Admitting: Internal Medicine

## 2016-05-20 VITALS — BP 128/82 | Temp 98.1°F

## 2016-05-20 DIAGNOSIS — L237 Allergic contact dermatitis due to plants, except food: Secondary | ICD-10-CM | POA: Diagnosis not present

## 2016-05-20 MED ORDER — PREDNISONE 10 MG PO TABS: ORAL_TABLET | ORAL | 0 refills | Status: DC

## 2016-05-20 MED ORDER — METHYLPREDNISOLONE ACETATE 80 MG/ML IJ SUSP
80.0000 mg | Freq: Once | INTRAMUSCULAR | Status: AC
  Administered 2016-05-20: 80 mg via INTRAMUSCULAR

## 2016-05-20 NOTE — Progress Notes (Signed)
   Subjective:    Patient ID: [name], female    DOB: 1955-12-28, 60 y.o.   MRN: [id number]  HPI  60 year old Female was treated over the Labor Day holiday with prednisone after calling saying she had contact dermatitis due to poison ivy. She took a six-day tapering course and finished that approximately September 8th.However shortly thereafter she began to develop recurrent rash consistent with contact dermatitis/poison ivy once again. Is itching and is miserable. Says has even more lesions than she did previously.    Review of Systems     Objective:   Physical Exam Linear erythematous papular lesions consistent with poison ivy on abdomen, arms, lower extremities       Assessment & Plan:  Poison ivy  Plan: Sterapred DS 10 mg 12 day dosepak take as directed.

## 2016-06-04 NOTE — Patient Instructions (Signed)
Sterapred DS 10 mg 12 day dosepak take as directed in tapering course 6-6-5-5-4-4-3-3-2--2-1-1 taper.

## 2016-08-27 ENCOUNTER — Other Ambulatory Visit: Payer: Self-pay | Admitting: Internal Medicine

## 2016-08-27 ENCOUNTER — Encounter: Payer: Self-pay | Admitting: Internal Medicine

## 2016-08-27 NOTE — Telephone Encounter (Signed)
[facility]

## 2016-10-13 ENCOUNTER — Encounter: Payer: Self-pay | Admitting: Internal Medicine

## 2016-10-13 ENCOUNTER — Other Ambulatory Visit: Payer: Self-pay | Admitting: Internal Medicine

## 2016-10-13 DIAGNOSIS — Z1231 Encounter for screening mammogram for malignant neoplasm of breast: Secondary | ICD-10-CM

## 2016-10-21 ENCOUNTER — Other Ambulatory Visit: Payer: BLUE CROSS/BLUE SHIELD | Admitting: Internal Medicine

## 2016-10-21 DIAGNOSIS — Z113 Encounter for screening for infections with a predominantly sexual mode of transmission: Secondary | ICD-10-CM

## 2016-10-21 DIAGNOSIS — M8580 Other specified disorders of bone density and structure, unspecified site: Secondary | ICD-10-CM

## 2016-10-21 DIAGNOSIS — Z1321 Encounter for screening for nutritional disorder: Secondary | ICD-10-CM

## 2016-10-21 DIAGNOSIS — Z1329 Encounter for screening for other suspected endocrine disorder: Secondary | ICD-10-CM

## 2016-10-21 DIAGNOSIS — Z13220 Encounter for screening for lipoid disorders: Secondary | ICD-10-CM

## 2016-10-21 DIAGNOSIS — Z1159 Encounter for screening for other viral diseases: Secondary | ICD-10-CM

## 2016-10-21 DIAGNOSIS — Z114 Encounter for screening for human immunodeficiency virus [HIV]: Secondary | ICD-10-CM

## 2016-10-21 DIAGNOSIS — Z0000 Encounter for general adult medical examination without abnormal findings: Secondary | ICD-10-CM

## 2016-10-21 LAB — CBC WITH DIFFERENTIAL/PLATELET
BASOS PCT: 1 %
Basophils Absolute: 79 cells/uL (ref 0–200)
EOS PCT: 1 %
Eosinophils Absolute: 79 cells/uL (ref 15–500)
HEMATOCRIT: 39.5 % (ref 35.0–45.0)
Hemoglobin: 12.8 g/dL (ref 11.7–15.5)
LYMPHS PCT: 34 %
Lymphs Abs: 2686 cells/uL (ref 850–3900)
MCH: 28.5 pg (ref 27.0–33.0)
MCHC: 32.4 g/dL (ref 32.0–36.0)
MCV: 88 fL (ref 80.0–100.0)
MONO ABS: 395 {cells}/uL (ref 200–950)
MPV: 9.6 fL (ref 7.5–12.5)
Monocytes Relative: 5 %
Neutro Abs: 4661 cells/uL (ref 1500–7800)
Neutrophils Relative %: 59 %
PLATELETS: 347 10*3/uL (ref 140–400)
RBC: 4.49 MIL/uL (ref 3.80–5.10)
RDW: 13.8 % (ref 11.0–15.0)
WBC: 7.9 10*3/uL (ref 3.8–10.8)

## 2016-10-21 LAB — LIPID PANEL
Cholesterol: 199 mg/dL (ref <200)
HDL: 52 mg/dL (ref >50)
LDL CALC: 113 mg/dL — AB (ref <100)
Total CHOL/HDL Ratio: 3.8 Ratio (ref <5.0)
Triglycerides: 170 mg/dL — ABNORMAL HIGH (ref <150)
VLDL: 34 mg/dL — ABNORMAL HIGH (ref <30)

## 2016-10-21 LAB — COMPREHENSIVE METABOLIC PANEL
ALK PHOS: 85 U/L (ref 33–130)
ALT: 27 U/L (ref 6–29)
AST: 21 U/L (ref 10–35)
Albumin: 4.4 g/dL (ref 3.6–5.1)
BILIRUBIN TOTAL: 0.5 mg/dL (ref 0.2–1.2)
BUN: 14 mg/dL (ref 7–25)
CALCIUM: 9.3 mg/dL (ref 8.6–10.4)
CO2: 29 mmol/L (ref 20–31)
CREATININE: 0.86 mg/dL (ref 0.50–0.99)
Chloride: 104 mmol/L (ref 98–110)
GLUCOSE: 84 mg/dL (ref 65–99)
Potassium: 4 mmol/L (ref 3.5–5.3)
SODIUM: 142 mmol/L (ref 135–146)
Total Protein: 6.9 g/dL (ref 6.1–8.1)

## 2016-10-21 LAB — TSH: TSH: 3.28 m[IU]/L

## 2016-10-21 NOTE — Addendum Note (Signed)
Addended by: Inocencio Homes on: 10/21/2016 09:31 AM   Modules accepted: Orders

## 2016-10-22 LAB — VITAMIN D 25 HYDROXY (VIT D DEFICIENCY, FRACTURES): VIT D 25 HYDROXY: 25 ng/mL — AB (ref 30–100)

## 2016-10-22 LAB — HIV ANTIBODY (ROUTINE TESTING W REFLEX): HIV 1&2 Ab, 4th Generation: NONREACTIVE

## 2016-10-22 LAB — HEPATITIS C ANTIBODY
HCV AB: NEGATIVE
HCV Ab: NEGATIVE

## 2016-10-23 ENCOUNTER — Encounter: Payer: Self-pay | Admitting: Internal Medicine

## 2016-10-23 ENCOUNTER — Ambulatory Visit (INDEPENDENT_AMBULATORY_CARE_PROVIDER_SITE_OTHER): Payer: BLUE CROSS/BLUE SHIELD | Admitting: Internal Medicine

## 2016-10-23 VITALS — BP 140/78 | HR 80 | Temp 97.2°F | Ht 64.0 in | Wt 209.8 lb

## 2016-10-23 DIAGNOSIS — M791 Myalgia: Secondary | ICD-10-CM

## 2016-10-23 DIAGNOSIS — Z0000 Encounter for general adult medical examination without abnormal findings: Secondary | ICD-10-CM

## 2016-10-23 DIAGNOSIS — M7918 Myalgia, other site: Secondary | ICD-10-CM

## 2016-10-23 DIAGNOSIS — Z8739 Personal history of other diseases of the musculoskeletal system and connective tissue: Secondary | ICD-10-CM | POA: Diagnosis not present

## 2016-10-23 DIAGNOSIS — D126 Benign neoplasm of colon, unspecified: Secondary | ICD-10-CM

## 2016-10-23 DIAGNOSIS — R030 Elevated blood-pressure reading, without diagnosis of hypertension: Secondary | ICD-10-CM | POA: Diagnosis not present

## 2016-10-23 DIAGNOSIS — E785 Hyperlipidemia, unspecified: Secondary | ICD-10-CM | POA: Diagnosis not present

## 2016-10-23 DIAGNOSIS — E559 Vitamin D deficiency, unspecified: Secondary | ICD-10-CM

## 2016-10-23 DIAGNOSIS — Z91038 Other insect allergy status: Secondary | ICD-10-CM | POA: Diagnosis not present

## 2016-10-23 DIAGNOSIS — Z91030 Bee allergy status: Secondary | ICD-10-CM

## 2016-10-23 LAB — POCT URINALYSIS DIPSTICK
BILIRUBIN UA: NEGATIVE
Blood, UA: NEGATIVE
GLUCOSE UA: NEGATIVE
Ketones, UA: NEGATIVE
LEUKOCYTES UA: NEGATIVE
Nitrite, UA: NEGATIVE
Protein, UA: NEGATIVE
Spec Grav, UA: <=1.005
Urobilinogen, UA: NEGATIVE
pH, UA: 6.5

## 2016-10-23 MED ORDER — CYCLOBENZAPRINE HCL 10 MG PO TABS: 10.0000 mg | ORAL_TABLET | Freq: Every day | ORAL | 5 refills | Status: DC

## 2016-10-23 NOTE — Patient Instructions (Addendum)
Prescription given for shingles vaccine. Prescription for home blood pressure monitor. Continue same medications. Take vitamin D daily. Call with blood pressure readings in 3-4 weeks. Take Flexeril at bedtime if needed for musculoskeletal pain. Continue statin medication. Take 2000 units vitamin D 3 daily.

## 2016-10-23 NOTE — Progress Notes (Signed)
Subjective:    Patient ID: [name], female    DOB: 1955-12-23, 61 y.o.   MRN: [id number]  HPI  61 year old Female with hyperlipidemia and recurrent contact dermatitis for health maintenance exam and evaluation of medical issues. She gets influenza vaccine through employment. She also has history of osteopenia, GE reflux, asthma.  She is allergic to bee stings. She has been allergy tested with skin test showing minimal positivity for mold. Probably tree showed significant postbronchodilator improvement.  She had a false positive HIV test done at the [facility] 06/28/1999.  History of left lower lobe pneumonia March 2006. GYN is [facility] [employer] [facility]. History of cervical disc disease with bulge C7-T1 noted in 2012. Had anterior cervical dissectomy and decompression with  one level fusion for right C8 radiculopathy. ACDF C7-T1. This was done by Dr. Vertell Limber.  Patient had Pneumovax 23 in 2006.  All records indicate she had total cholesterol of 241 in 2006, triglycerides of 189, LDL cholesterol 146. Statin therapy was suggested at that time but patient wanted to wait. We convinced her to start statin therapy in 2015.  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 She is married. Does not smoke or consume alcohol. 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  Family history: Father's family history is not known. Mother with history of hypertension hyperlipidemia cardiac arrhythmia. One brother is overweight with history of alcoholism. Another brother with history of smoking another brother whose health is okay. No sisters. She has 2 children, son and a daughter both adults in good health.  Colonoscopy 2014 by Dr. Olevia Perches with an adenomatous colon polyp noted.    Review of Systems  Constitutional: Negative.   All other systems reviewed and are negative.  see above     Objective:   Physical Exam  Constitutional: She is  oriented to person, place, and time. She appears well-developed and well-nourished. No distress.  HENT:  Head: Normocephalic and atraumatic.  Right Ear: External ear normal.  Left Ear: External ear normal.  Mouth/Throat: Oropharynx is clear and moist.  Eyes: Conjunctivae and EOM are normal. Pupils are equal, round, and reactive to light. Right eye exhibits no discharge. Left eye exhibits no discharge. No scleral icterus.  Neck: Neck supple. No JVD present. No thyromegaly present.  Cardiovascular: Normal rate, regular rhythm, normal heart sounds and intact distal pulses.   No murmur heard. Breasts normal female  Pulmonary/Chest: Effort normal and breath sounds normal. No respiratory distress. She has no wheezes. She has no rales. She exhibits no tenderness.  Abdominal: Soft. Bowel sounds are normal. She exhibits no distension and no mass. There is no tenderness. There is no rebound and no guarding.  Genitourinary:  Genitourinary Comments: Deferred to GYN  Musculoskeletal: She exhibits no edema.  Lymphadenopathy:    She has no cervical adenopathy.  Neurological: She is alert and oriented to person, place, and time. She has normal reflexes. No cranial nerve deficit. Coordination normal.  Skin: Skin is warm and dry. No rash noted. She is not diaphoretic.  Epidermoid cyst upper back which is benign  Psychiatric: She has a normal mood and affect. Her behavior is normal. Judgment and thought content normal.  Vitals reviewed.         Assessment & Plan:  Hyperlipidemia-stable on statin medication. Triglycerides are 170 and LDL cholesterol 113. Work on diet and exercise. Continue Lipitor 10 mg daily.  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  Vitamin D deficiency-take 2000 units vitamin D 3 daily. Level is 25  Health maintenance-prescription given for shingles vaccine  Plan: Continue same medications and return in one year or as needed.

## 2016-11-14 ENCOUNTER — Other Ambulatory Visit: Payer: Self-pay

## 2016-11-14 MED ORDER — CYCLOBENZAPRINE HCL 10 MG PO TABS: 10.0000 mg | ORAL_TABLET | Freq: Every day | ORAL | 5 refills | Status: DC

## 2016-11-18 ENCOUNTER — Ambulatory Visit: Payer: BLUE CROSS/BLUE SHIELD

## 2016-11-20 ENCOUNTER — Ambulatory Visit
Admission: RE | Admit: 2016-11-20 | Discharge: 2016-11-20 | Disposition: A | Discharge status: Home / Self Care | Payer: BLUE CROSS/BLUE SHIELD | Source: Ambulatory Visit | Attending: Internal Medicine | Admitting: Internal Medicine

## 2016-11-20 DIAGNOSIS — Z1231 Encounter for screening mammogram for malignant neoplasm of breast: Secondary | ICD-10-CM

## 2016-11-25 ENCOUNTER — Other Ambulatory Visit: Payer: Self-pay | Admitting: Internal Medicine

## 2017-04-04 ENCOUNTER — Encounter (HOSPITAL_COMMUNITY): Payer: Self-pay | Admitting: Emergency Medicine

## 2017-04-04 ENCOUNTER — Emergency Department (HOSPITAL_COMMUNITY)
Admission: EM | Admit: 2017-04-04 | Discharge: 2017-04-04 | Disposition: A | Discharge status: Home / Self Care | Payer: BLUE CROSS/BLUE SHIELD | Attending: Emergency Medicine | Admitting: Emergency Medicine

## 2017-04-04 DIAGNOSIS — S81812A Laceration without foreign body, left lower leg, initial encounter: Secondary | ICD-10-CM | POA: Insufficient documentation

## 2017-04-04 DIAGNOSIS — Y999 Unspecified external cause status: Secondary | ICD-10-CM | POA: Insufficient documentation

## 2017-04-04 DIAGNOSIS — Z79899 Other long term (current) drug therapy: Secondary | ICD-10-CM | POA: Diagnosis not present

## 2017-04-04 DIAGNOSIS — J45909 Unspecified asthma, uncomplicated: Secondary | ICD-10-CM | POA: Insufficient documentation

## 2017-04-04 DIAGNOSIS — Y93E5 Activity, floor mopping and cleaning: Secondary | ICD-10-CM | POA: Insufficient documentation

## 2017-04-04 DIAGNOSIS — Y92009 Unspecified place in unspecified non-institutional (private) residence as the place of occurrence of the external cause: Secondary | ICD-10-CM | POA: Diagnosis not present

## 2017-04-04 DIAGNOSIS — W25XXXA Contact with sharp glass, initial encounter: Secondary | ICD-10-CM | POA: Diagnosis not present

## 2017-04-04 MED ORDER — POVIDONE-IODINE 10 % EX SOLN
CUTANEOUS | Status: AC
  Filled 2017-04-04: qty 15

## 2017-04-04 MED ORDER — CEPHALEXIN 500 MG PO CAPS: 500.0000 mg | ORAL_CAPSULE | Freq: Two times a day (BID) | ORAL | 0 refills | Status: AC

## 2017-04-04 MED ORDER — LIDOCAINE-EPINEPHRINE (PF) 2 %-1:200000 IJ SOLN
10.0000 mL | Freq: Once | INTRAMUSCULAR | Status: DC
  Filled 2017-04-04: qty 20

## 2017-04-04 NOTE — ED Provider Notes (Signed)
[facility] DEPT Provider Note   CSN: [id number] Arrival date & time: 04/04/17  1336     History   Chief Complaint Chief Complaint  Patient presents with  . Laceration    HPI [name] is a 61 y.o. female.  HPI  Patient presents to ED for evaluation of laceration on left lower extremity that occurred 5 hours prior to arrival. She states that she was cleaning up a new rental property when she picked up a trash can with a piece of glass sticking out which ended up cutting her left leg. She has been ambulatory since the incident. States that the area bled a little bit but bleeding was controlled after shower. She denies any numbness, weakness, fevers. States that her last tetanus shot was 2 years ago.  Past Medical History:  Diagnosis Date  . Asthma    NO PROBLEMS IN4/5 YRS  . Hyperlipidemia    NOT ON MEDICATION  . Osteopenia     Patient Active Problem List   Diagnosis Date Noted  . Unspecified vitamin D deficiency 01/08/2013  . Other and unspecified hyperlipidemia 01/08/2013  . Bee sting allergy 11/14/2012  . Allergic rhinitis 11/14/2012  . Osteopenia 11/14/2012    Past Surgical History:  Procedure Laterality Date  . ANTERIOR CERVICAL DECOMP/DISCECTOMY FUSION  09/11/2011   Procedure: ANTERIOR CERVICAL DECOMPRESSION/DISCECTOMY FUSION 1 LEVEL;  Surgeon: Peggyann Shoals, MD;  Location: [facility];  Service: Neurosurgery;  Laterality: N/A;  Cervical seven-Thoracic one anterior cervical decompression/discectomy fusion  . BILATERAL CARPAL TUNNEL RELEASE Bilateral 08/09/2015   Procedure: BILATERAL CARPAL TUNNEL RELEASE;  Surgeon: Daryll Brod, MD;  Location: [facility];  Service: Orthopedics;  Laterality: Bilateral;    OB History    No data available       Home Medications    Prior to Admission medications   Medication Sig Start Date End Date Taking? Authorizing Provider  atorvastatin (LIPITOR) 10 MG tablet TAKE 1 TABLET DAILY AT 6 P.M. 11/25/16   Yes Baxley, Cresenciano Lick, MD  cholecalciferol (VITAMIN D) 1000 units tablet Take 1,000 Units by mouth daily.   Yes [provider]  magnesium oxide (MAG-OX) 400 MG tablet Take 400 mg by mouth daily.   Yes [provider]  cephALEXin (KEFLEX) 500 MG capsule Take 1 capsule (500 mg total) by mouth 2 (two) times daily. 04/04/17 04/11/17  Delia Heady, PA-C    Family History Family History  Problem Relation Age of Onset  . Hyperlipidemia Mother   . Hypertension Mother   . Alcohol abuse Brother   . Colon polyps Brother   . Colon polyps Father   . Colon cancer Neg Hx   . Stomach cancer Neg Hx     Social History Social History  Substance Use Topics  . Smoking status: Never Smoker  . Smokeless tobacco: Never Used  . Alcohol use No     Allergies   Latex   Review of Systems Review of Systems  Constitutional: Negative for appetite change, chills and fever.  Gastrointestinal: Negative for nausea and vomiting.  Musculoskeletal: Negative for myalgias.  Skin: Positive for wound. Negative for rash.  Neurological: Negative for dizziness, weakness and light-headedness.     Physical Exam Updated Vital Signs BP (!) 156/93 (BP Location: Right Arm)   Pulse 73   Temp 98.1 F (36.7 C) (Oral)   Resp 18   Ht 5\' 6"  (1.676 m)   Wt 95.3 kg (210 lb)   SpO2 100%   BMI  33.89 kg/m   Physical Exam  Constitutional: She appears well-developed and well-nourished. No distress.  HENT:  Head: Normocephalic and atraumatic.  Eyes: Conjunctivae and EOM are normal. No scleral icterus.  Neck: Normal range of motion.  Pulmonary/Chest: Effort normal. No respiratory distress.  Musculoskeletal: Normal range of motion.  Full active and passive range of motion of the left lower extremity.  Neurological: She is alert.  Skin: No rash noted. She is not diaphoretic.  4 cm linear laceration on the left shin. Approx 0.25cm deep. No active bleeding or drainage noted. Sensation intact to light touch.    Psychiatric: She has a normal mood and affect.  Nursing note and vitals reviewed.    ED Treatments / Results  Labs (all labs ordered are listed, but only abnormal results are displayed) Labs Reviewed - No data to display  EKG  EKG Interpretation None       Radiology No results found.  Procedures .Marland KitchenLaceration Repair Date/Time: 04/04/2017 4:05 PM Performed by: Delia Heady Authorized by: Delia Heady   Consent:    Consent obtained:  Verbal   Consent given by:  Patient   Risks discussed:  Infection, pain, poor cosmetic result and need for additional repair Anesthesia (see MAR for exact dosages):    Anesthesia method:  Local infiltration   Local anesthetic:  Lidocaine 2% WITH epi Laceration details:    Location:  Leg   Leg location:  L lower leg   Length (cm):  4 Repair type:    Repair type:  Simple Pre-procedure details:    Preparation:  Patient was prepped and draped in usual sterile fashion Exploration:    Hemostasis achieved with:  Epinephrine   Wound exploration: wound explored through full range of motion   Treatment:    Area cleansed with:  Betadine and saline   Amount of cleaning:  Standard   Irrigation method:  Pressure wash and syringe Skin repair:    Repair method:  Sutures   Suture size:  4-0   Wound skin closure material used: Ethilon.   Suture technique:  Simple interrupted   Number of sutures:  8 Approximation:    Approximation:  Close Post-procedure details:    Patient tolerance of procedure:  Tolerated well, no immediate complications   (including critical care time)  Medications Ordered in ED Medications  lidocaine-EPINEPHrine (XYLOCAINE W/EPI) 2 %-1:200000 (PF) injection 10 mL (10 mLs Infiltration Handoff 04/04/17 1612)  povidone-iodine (BETADINE) 10 % external solution (  Handoff 04/04/17 1612)     Initial Impression / Assessment and Plan / ED Course  I have reviewed the triage vital signs and the nursing notes.  Pertinent labs &  imaging results that were available during my care of the patient were reviewed by me and considered in my medical decision making (see chart for details).     Patient presents to ED for evaluation of laceration on left leg that occurred 5 hours prior to arrival. Bleeding is controlled at this time. Area cleaned extensively and probed for foreign bodies. None noted. Full active and passive ROM of the leg. Area was sutured with Ethilon sutures. Patient states the glass was dirty because the entire rental property was dirty and infested with insects. Will place on Keflex and advised to return for suture removal in 12-14 days. Patient appears stable for discharge at this time. Strict return precautions given.  Final Clinical Impressions(s) / ED Diagnoses   Final diagnoses:  Laceration of left lower extremity, initial encounter  New Prescriptions Discharge Medication List as of 04/04/2017  4:09 PM    START taking these medications   Details  cephALEXin (KEFLEX) 500 MG capsule Take 1 capsule (500 mg total) by mouth 2 (two) times daily., Starting Sat 04/04/2017, Until Sat 04/11/2017, Print         Shelly Coss [place], [place] 04/04/17 1637    Milton Ferguson, MD 04/04/17 2245

## 2017-04-04 NOTE — ED Triage Notes (Signed)
Pt reports cleaning up a rental property and the bag of trash had glass in it and cut her left lateral leg.

## 2017-04-04 NOTE — ED Notes (Signed)
Cut leg with piece of glass in a garbage bag

## 2017-04-04 NOTE — Discharge Instructions (Signed)
Please read attached information regarding your condition. Take Keflex twice daily for 1 week. Return in 12-14 days for suture removal to ED, urgent care or PCP office, sooner for signs of infection, drainage, fevers, temperature or color change.

## 2017-05-18 ENCOUNTER — Ambulatory Visit (INDEPENDENT_AMBULATORY_CARE_PROVIDER_SITE_OTHER): Payer: BLUE CROSS/BLUE SHIELD | Admitting: Internal Medicine

## 2017-05-18 ENCOUNTER — Ambulatory Visit
Admission: RE | Admit: 2017-05-18 | Discharge: 2017-05-18 | Disposition: A | Discharge status: Home / Self Care | Payer: BLUE CROSS/BLUE SHIELD | Source: Ambulatory Visit | Attending: Internal Medicine | Admitting: Internal Medicine

## 2017-05-18 VITALS — BP 140/78 | HR 75 | Temp 98.4°F | Wt 208.0 lb

## 2017-05-18 DIAGNOSIS — W19XXXA Unspecified fall, initial encounter: Secondary | ICD-10-CM

## 2017-05-18 DIAGNOSIS — S93401A Sprain of unspecified ligament of right ankle, initial encounter: Secondary | ICD-10-CM

## 2017-06-07 ENCOUNTER — Encounter: Payer: Self-pay | Admitting: Internal Medicine

## 2017-06-07 NOTE — Patient Instructions (Signed)
Apply ice for pain and swelling. May use Ace wrap for support. Should heal in a couple of weeks. X-rays are negative.

## 2017-06-07 NOTE — Progress Notes (Signed)
   Subjective:    Patient ID: [name], female    DOB: [birth year], 61 y.o.   MRN: [id number]  HPI Patient sustained a fall at her home about 2 weeks ago and has continued to experience some pain right ankle which is concerning to her. She has a history of osteopenia.    Review of Systems see above     Objective:   Physical Exam  Point tenderness anterior right ankle and lateral ankle. Mild swelling.      Assessment & Plan:  Rule out occult fracture of right ankle or fibula avulsion  Plan: X-ray of right tib-fib and right ankle  Addendum: X-rays are negative. This is likely a sprain and should resolve. May use Ace wrap for support.

## 2017-09-08 HISTORY — PX: ROTATOR CUFF REPAIR: SHX139

## 2017-10-28 ENCOUNTER — Encounter: Payer: Self-pay | Admitting: Gastroenterology

## 2017-11-19 ENCOUNTER — Encounter: Payer: Self-pay | Admitting: Internal Medicine

## 2017-11-19 ENCOUNTER — Ambulatory Visit (INDEPENDENT_AMBULATORY_CARE_PROVIDER_SITE_OTHER): Payer: BLUE CROSS/BLUE SHIELD | Admitting: Internal Medicine

## 2017-11-19 VITALS — BP 100/70 | HR 90 | Wt 204.0 lb

## 2017-11-19 DIAGNOSIS — S39012A Strain of muscle, fascia and tendon of lower back, initial encounter: Secondary | ICD-10-CM | POA: Diagnosis not present

## 2017-11-19 MED ORDER — CYCLOBENZAPRINE HCL 10 MG PO TABS: 10.0000 mg | ORAL_TABLET | Freq: Three times a day (TID) | ORAL | 2 refills | Status: DC | PRN

## 2017-11-19 MED ORDER — PREDNISONE 10 MG PO TABS: ORAL_TABLET | ORAL | 0 refills | Status: DC

## 2017-11-22 ENCOUNTER — Other Ambulatory Visit: Payer: Self-pay | Admitting: Internal Medicine

## 2017-12-02 NOTE — Progress Notes (Signed)
   Subjective:    Patient ID: [name], female    DOB: 1956-05-10, 62 y.o.   MRN: [id number]  HPI She called recently about poison ivy exposure over the weekend.  She never broke out with poison ivy.  She took a bath after she discovered she had been exposed to it.  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  Then she sort of slipped trying to help her mother get into a vehicle.  The side of the road was slippery.  Has developed some low back pain.  She was concerned about a possible disc.  It is not radiating down her leg significantly.  Seems to slowly be getting better with over-the-counter anti-inflammatory medication although recently she has not felt well and stayed in the bed for a good part of the day.    Review of Systems see [name] is a bit anxious about this     Objective:   Physical Exam Straight leg raising is negative at 90 degrees bilaterally in the lower extremities.  Muscle strength in the lower extremities is normal.  Sensation is intact and deep tendon reflexes in the knees are 2+ and symmetrical.       Assessment & Plan:  Low back strain  Plan: She will take course of prednisone going from 60 mg to 0 mg over 7 days.  Have refilled Flexeril for her to take up to 3 times daily as needed recognizing it could cause drowsiness.  Given #30 with 2 refills.

## 2017-12-02 NOTE — Patient Instructions (Signed)
Take prednisone and tapering course as directed.  Flexeril up to 3 times daily as needed for back pain.  Call if symptoms do not improve.

## 2018-01-12 ENCOUNTER — Other Ambulatory Visit: Payer: Self-pay | Admitting: Internal Medicine

## 2018-02-09 DIAGNOSIS — Z1231 Encounter for screening mammogram for malignant neoplasm of breast: Secondary | ICD-10-CM | POA: Diagnosis not present

## 2018-02-09 DIAGNOSIS — Z01419 Encounter for gynecological examination (general) (routine) without abnormal findings: Secondary | ICD-10-CM | POA: Diagnosis not present

## 2018-02-09 DIAGNOSIS — Z6833 Body mass index (BMI) 33.0-33.9, adult: Secondary | ICD-10-CM | POA: Diagnosis not present

## 2018-02-16 ENCOUNTER — Other Ambulatory Visit: Payer: Self-pay

## 2018-02-16 DIAGNOSIS — E559 Vitamin D deficiency, unspecified: Secondary | ICD-10-CM

## 2018-02-16 DIAGNOSIS — E785 Hyperlipidemia, unspecified: Secondary | ICD-10-CM

## 2018-02-16 DIAGNOSIS — Z8739 Personal history of other diseases of the musculoskeletal system and connective tissue: Secondary | ICD-10-CM

## 2018-02-16 DIAGNOSIS — Z0000 Encounter for general adult medical examination without abnormal findings: Secondary | ICD-10-CM

## 2018-02-23 ENCOUNTER — Other Ambulatory Visit: Payer: BLUE CROSS/BLUE SHIELD | Admitting: Internal Medicine

## 2018-02-25 ENCOUNTER — Encounter: Payer: BLUE CROSS/BLUE SHIELD | Admitting: Internal Medicine

## 2018-03-04 ENCOUNTER — Other Ambulatory Visit: Payer: BLUE CROSS/BLUE SHIELD | Admitting: Internal Medicine

## 2018-03-04 DIAGNOSIS — Z8739 Personal history of other diseases of the musculoskeletal system and connective tissue: Secondary | ICD-10-CM | POA: Diagnosis not present

## 2018-03-04 DIAGNOSIS — E559 Vitamin D deficiency, unspecified: Secondary | ICD-10-CM | POA: Diagnosis not present

## 2018-03-04 DIAGNOSIS — Z0000 Encounter for general adult medical examination without abnormal findings: Secondary | ICD-10-CM | POA: Diagnosis not present

## 2018-03-04 DIAGNOSIS — E785 Hyperlipidemia, unspecified: Secondary | ICD-10-CM

## 2018-03-05 LAB — COMPLETE METABOLIC PANEL WITH GFR
AG Ratio: 1.7 (calc) (ref 1.0–2.5)
ALKALINE PHOSPHATASE (APISO): 96 U/L (ref 33–130)
ALT: 33 U/L — AB (ref 6–29)
AST: 27 U/L (ref 10–35)
Albumin: 4.5 g/dL (ref 3.6–5.1)
BUN: 16 mg/dL (ref 7–25)
CALCIUM: 9.8 mg/dL (ref 8.6–10.4)
CO2: 30 mmol/L (ref 20–32)
CREATININE: 0.86 mg/dL (ref 0.50–0.99)
Chloride: 103 mmol/L (ref 98–110)
GFR, EST NON AFRICAN AMERICAN: 72 mL/min/{1.73_m2} (ref >=60)
GFR, Est African American: 84 mL/min/{1.73_m2} (ref >=60)
GLUCOSE: 93 mg/dL (ref 65–99)
Globulin: 2.7 g/dL (calc) (ref 1.9–3.7)
Potassium: 4.1 mmol/L (ref 3.5–5.3)
SODIUM: 141 mmol/L (ref 135–146)
Total Bilirubin: 1 mg/dL (ref 0.2–1.2)
Total Protein: 7.2 g/dL (ref 6.1–8.1)

## 2018-03-05 LAB — CBC WITH DIFFERENTIAL/PLATELET
BASOS PCT: 0.6 %
Basophils Absolute: 41 cells/uL (ref 0–200)
Eosinophils Absolute: 90 cells/uL (ref 15–500)
Eosinophils Relative: 1.3 %
HCT: 40.3 % (ref 35.0–45.0)
HEMOGLOBIN: 13.1 g/dL (ref 11.7–15.5)
Lymphs Abs: 2270 cells/uL (ref 850–3900)
MCH: 28.2 pg (ref 27.0–33.0)
MCHC: 32.5 g/dL (ref 32.0–36.0)
MCV: 86.7 fL (ref 80.0–100.0)
MPV: 10.3 fL (ref 7.5–12.5)
Monocytes Relative: 5.7 %
NEUTROS ABS: 4106 {cells}/uL (ref 1500–7800)
Neutrophils Relative %: 59.5 %
PLATELETS: 346 10*3/uL (ref 140–400)
RBC: 4.65 10*6/uL (ref 3.80–5.10)
RDW: 13 % (ref 11.0–15.0)
TOTAL LYMPHOCYTE: 32.9 %
WBC: 6.9 10*3/uL (ref 3.8–10.8)
WBCMIX: 393 {cells}/uL (ref 200–950)

## 2018-03-05 LAB — LIPID PANEL
CHOL/HDL RATIO: 4.4 (calc) (ref <5.0)
Cholesterol: 200 mg/dL — ABNORMAL HIGH (ref <200)
HDL: 45 mg/dL — ABNORMAL LOW (ref >50)
LDL CHOLESTEROL (CALC): 125 mg/dL — AB
NON-HDL CHOLESTEROL (CALC): 155 mg/dL — AB (ref <130)
Triglycerides: 183 mg/dL — ABNORMAL HIGH (ref <150)

## 2018-03-05 LAB — TSH: TSH: 3.67 mIU/L (ref 0.40–4.50)

## 2018-03-05 LAB — VITAMIN D 25 HYDROXY (VIT D DEFICIENCY, FRACTURES): VIT D 25 HYDROXY: 20 ng/mL — AB (ref 30–100)

## 2018-03-09 ENCOUNTER — Encounter: Payer: BLUE CROSS/BLUE SHIELD | Admitting: Internal Medicine

## 2018-03-29 ENCOUNTER — Ambulatory Visit
Admission: RE | Admit: 2018-03-29 | Discharge: 2018-03-29 | Disposition: A | Discharge status: Home / Self Care | Payer: BLUE CROSS/BLUE SHIELD | Source: Ambulatory Visit | Attending: Internal Medicine | Admitting: Internal Medicine

## 2018-03-29 ENCOUNTER — Encounter: Payer: Self-pay | Admitting: Internal Medicine

## 2018-03-29 ENCOUNTER — Ambulatory Visit (INDEPENDENT_AMBULATORY_CARE_PROVIDER_SITE_OTHER): Payer: BLUE CROSS/BLUE SHIELD | Admitting: Internal Medicine

## 2018-03-29 VITALS — BP 130/80 | HR 90 | Temp 98.1°F | Wt 209.0 lb

## 2018-03-29 DIAGNOSIS — R52 Pain, unspecified: Secondary | ICD-10-CM

## 2018-03-29 DIAGNOSIS — M79621 Pain in right upper arm: Secondary | ICD-10-CM

## 2018-03-29 MED ORDER — PREDNISONE 10 MG PO TABS: ORAL_TABLET | ORAL | 1 refills | Status: DC

## 2018-03-29 NOTE — Progress Notes (Signed)
   Subjective:    Patient ID: [name], female    DOB: [birth year], 62 y.o.   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  Has developed pain in her left mid humerus.  It hurts to lift items.  It is tender to touch.    Review of Systems     Objective:   Physical Exam She has normal range of motion but it hurts some to raise her arm up over her head.  There is point tenderness in her mid humerus.  She will have x-ray of the humerus.       Assessment & Plan:  Suspect she has biceps tendinitis  Plan: She has Flexeril on hand if she should need that his muscle relaxant.  Recommended ice to arm 20 minutes twice daily.  Have x-ray of right humerus: Addendum x-ray is negative.  She will be treated with prednisone going from 60 mg to 0 mg over 7 days.  If symptoms do not improve she is to contact me.

## 2018-04-07 NOTE — Patient Instructions (Signed)
Take prednisone as directed and tapering course going from 60 mg to 0 mg over 7 days.  Apply ice 20 minutes twice daily and call if symptoms do not improve.

## 2018-04-12 ENCOUNTER — Other Ambulatory Visit: Payer: Self-pay | Admitting: Internal Medicine

## 2018-04-27 ENCOUNTER — Encounter: Payer: Self-pay | Admitting: Internal Medicine

## 2018-04-27 ENCOUNTER — Ambulatory Visit (INDEPENDENT_AMBULATORY_CARE_PROVIDER_SITE_OTHER): Payer: BLUE CROSS/BLUE SHIELD | Admitting: Internal Medicine

## 2018-04-27 VITALS — BP 130/72 | HR 94 | Ht 64.25 in | Wt 202.0 lb

## 2018-04-27 DIAGNOSIS — E785 Hyperlipidemia, unspecified: Secondary | ICD-10-CM

## 2018-04-27 DIAGNOSIS — Z8739 Personal history of other diseases of the musculoskeletal system and connective tissue: Secondary | ICD-10-CM | POA: Diagnosis not present

## 2018-04-27 DIAGNOSIS — Z0000 Encounter for general adult medical examination without abnormal findings: Secondary | ICD-10-CM | POA: Diagnosis not present

## 2018-04-27 DIAGNOSIS — E559 Vitamin D deficiency, unspecified: Secondary | ICD-10-CM | POA: Diagnosis not present

## 2018-04-27 DIAGNOSIS — M7918 Myalgia, other site: Secondary | ICD-10-CM

## 2018-04-27 DIAGNOSIS — M67921 Unspecified disorder of synovium and tendon, right upper arm: Secondary | ICD-10-CM

## 2018-04-27 LAB — POCT URINALYSIS DIPSTICK
Appearance: NORMAL
BILIRUBIN UA: NEGATIVE
GLUCOSE UA: NEGATIVE
Ketones, UA: NEGATIVE
Leukocytes, UA: NEGATIVE
Nitrite, UA: NEGATIVE
Odor: NORMAL
Protein, UA: NEGATIVE
RBC UA: NEGATIVE
SPEC GRAV UA: 1.015 (ref 1.010–1.025)
UROBILINOGEN UA: 0.2 U/dL
pH, UA: 6 (ref 5.0–8.0)

## 2018-04-27 MED ORDER — CYCLOBENZAPRINE HCL 10 MG PO TABS: 10.0000 mg | ORAL_TABLET | Freq: Three times a day (TID) | ORAL | 2 refills | Status: DC | PRN

## 2018-04-27 MED ORDER — ATORVASTATIN CALCIUM 20 MG PO TABS: 20.0000 mg | ORAL_TABLET | Freq: Every day | ORAL | 3 refills | Status: DC

## 2018-04-27 NOTE — Patient Instructions (Addendum)
Increase Lipitor to 20 mg daily. RTC in 3 months. Increase Vitamin D supplement.  Orthopedist to see you regarding arm injury

## 2018-04-27 NOTE — Progress Notes (Signed)
Subjective:    Patient ID: [name], female    DOB: 1956/06/25, 62 y.o.   MRN: [id number]  HPI 62 year old Female for health maintenance exam and evaluation of medical issues.  History of vitamin D deficiency.  A year ago level was 25 and is now 7.  Is on Lipitor 20 mg daily for hyperlipidemia.  History of musculoskeletal pain and  upper arm pain.  Have prescribed Flexeril.  She is also taking ibuprofen.  Seems to have developed tendinopathy of  upper extremity.  Was seen in July treated with prednisone and improved some but it has not completely healed and will be referred to orthopedist.  History of hyperlipidemia.  History of recurrent contact dermatitis/poison ivy.  She has a history of osteopenia, GE reflux and asthma.  She is allergic to bee stings.  She has been allergy tested with skin test showing minimal positivity for mold.  Spirometry showed significant postbronchodilator improvement.  She had a false positive HIV test done at the [facility] October 2000 but is no longer allowed to give blood.  History of left lower lobe pneumonia March 2006.  GYN is [employer] [facility].  History of cervical disc disease with bulge C7-T1 noted in T12.  Had anterior cervical discectomy and decompression with 1 level fusion for right C8 radiculopathy ACDF C7-T1 done by Dr. Vertell Limber.  Old records indicate she had total cholesterol of 241 in 2006 with triglycerides of 189 LDL cholesterol 146.  I convinced her to start statin therapy in 2015.  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  She is married.  Does not smoke or consume alcohol.  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  Family history: Father's family history is not known.  Mother with history of hypertension hyperlipidemia and cardiac arrhythmia.  One brother is overweight with history of alcoholism.  Another brother with history of smoking.  Another brother whose  health is okay.  No sisters.  She has 2 children, son and a daughter both adults in good health.  Colonoscopy 2014 by Dr. Olevia Perches with an adenomatous polyp noted.        Review of Systems had mammogram at [facility] currently main issue is right arm discomfort and decreased range of motion.  Having issues raising right arm up over her head and has palpable tenderness right upper arm medial aspect.  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     Objective:   Physical Exam  Constitutional: She is oriented to person, place, and time. She appears well-developed and well-nourished.  HENT:  Head: Normocephalic and atraumatic.  Right Ear: External ear normal.  Left Ear: External ear normal.  Mouth/Throat: Oropharynx is clear and moist.  Eyes: Pupils are equal, round, and reactive to light. EOM are normal.  Musculoskeletal:  Decreased range of motion right upper extremity.  No obvious deformity.  Tender right upper medial arm  Neurological: She is alert and oriented to person, place, and time. She displays normal reflexes. No cranial nerve deficit. Coordination normal.          Assessment & Plan:  Upper arm tendinopathy-not improving.  Refer to orthopedist  History of recurrent contact dermatitis related to work on forearm  Hyperlipidemia stable on statin medication  GE reflux-currently not on chronic PPI  Allergic rhinitis-has seen allergist in the past  Osteopenia-last bone density  study was done in 2016 showing T score of -1.5 in the spine and -1.3 in the left femoral neck.  Needs to be repeated.  History of asthma-no recent exacerbations  Musculoskeletal pain treated with Flexeril as needed  History of vitamin D deficiency-needs to take 2000 units vitamin D3 daily  Plan: Needs to take 2000 units vitamin D3 regularly.  Triglycerides are elevated at 183.  Total cholesterol 200 and LDL cholesterol 125.  Increase Lipitor from 10 to 20 mg daily and follow-up  in 3 months.

## 2018-05-11 DIAGNOSIS — M7541 Impingement syndrome of right shoulder: Secondary | ICD-10-CM | POA: Diagnosis not present

## 2018-05-25 DIAGNOSIS — M7541 Impingement syndrome of right shoulder: Secondary | ICD-10-CM | POA: Diagnosis not present

## 2018-06-02 DIAGNOSIS — M7541 Impingement syndrome of right shoulder: Secondary | ICD-10-CM | POA: Diagnosis not present

## 2018-06-22 DIAGNOSIS — M7541 Impingement syndrome of right shoulder: Secondary | ICD-10-CM | POA: Diagnosis not present

## 2018-06-23 DIAGNOSIS — M7541 Impingement syndrome of right shoulder: Secondary | ICD-10-CM | POA: Diagnosis not present

## 2018-06-23 DIAGNOSIS — M25511 Pain in right shoulder: Secondary | ICD-10-CM | POA: Diagnosis not present

## 2018-06-24 ENCOUNTER — Other Ambulatory Visit: Payer: Self-pay | Admitting: Internal Medicine

## 2018-07-15 DIAGNOSIS — M7541 Impingement syndrome of right shoulder: Secondary | ICD-10-CM | POA: Diagnosis not present

## 2018-07-19 DIAGNOSIS — M7541 Impingement syndrome of right shoulder: Secondary | ICD-10-CM | POA: Diagnosis not present

## 2018-07-26 ENCOUNTER — Other Ambulatory Visit: Payer: Self-pay | Admitting: Internal Medicine

## 2018-07-26 DIAGNOSIS — Z79899 Other long term (current) drug therapy: Secondary | ICD-10-CM

## 2018-07-26 DIAGNOSIS — E785 Hyperlipidemia, unspecified: Secondary | ICD-10-CM

## 2018-07-26 DIAGNOSIS — Z5181 Encounter for therapeutic drug level monitoring: Secondary | ICD-10-CM

## 2018-07-30 ENCOUNTER — Other Ambulatory Visit: Payer: BLUE CROSS/BLUE SHIELD | Admitting: Internal Medicine

## 2018-07-30 DIAGNOSIS — Z79899 Other long term (current) drug therapy: Secondary | ICD-10-CM

## 2018-07-30 DIAGNOSIS — Z5181 Encounter for therapeutic drug level monitoring: Secondary | ICD-10-CM

## 2018-07-30 DIAGNOSIS — E785 Hyperlipidemia, unspecified: Secondary | ICD-10-CM | POA: Diagnosis not present

## 2018-07-30 LAB — LIPID PANEL
CHOL/HDL RATIO: 4.4 (calc) (ref <5.0)
Cholesterol: 201 mg/dL — ABNORMAL HIGH (ref <200)
HDL: 46 mg/dL — AB (ref >50)
LDL Cholesterol (Calc): 125 mg/dL (calc) — ABNORMAL HIGH
Non-HDL Cholesterol (Calc): 155 mg/dL (calc) — ABNORMAL HIGH (ref <130)
TRIGLYCERIDES: 180 mg/dL — AB (ref <150)

## 2018-07-30 LAB — HEPATIC FUNCTION PANEL
AG Ratio: 1.9 (calc) (ref 1.0–2.5)
ALBUMIN MSPROF: 4.4 g/dL (ref 3.6–5.1)
ALT: 34 U/L — ABNORMAL HIGH (ref 6–29)
AST: 28 U/L (ref 10–35)
Alkaline phosphatase (APISO): 83 U/L (ref 33–130)
BILIRUBIN DIRECT: 0.1 mg/dL (ref 0.0–0.2)
BILIRUBIN TOTAL: 0.5 mg/dL (ref 0.2–1.2)
Globulin: 2.3 g/dL (calc) (ref 1.9–3.7)
Indirect Bilirubin: 0.4 mg/dL (calc) (ref 0.2–1.2)
Total Protein: 6.7 g/dL (ref 6.1–8.1)

## 2018-08-03 ENCOUNTER — Encounter: Payer: Self-pay | Admitting: Internal Medicine

## 2018-08-03 ENCOUNTER — Ambulatory Visit (INDEPENDENT_AMBULATORY_CARE_PROVIDER_SITE_OTHER): Payer: BLUE CROSS/BLUE SHIELD | Admitting: Internal Medicine

## 2018-08-03 VITALS — BP 140/70 | HR 93 | Ht 64.25 in | Wt 207.0 lb

## 2018-08-03 DIAGNOSIS — E782 Mixed hyperlipidemia: Secondary | ICD-10-CM

## 2018-08-03 NOTE — Patient Instructions (Signed)
Continue same dose of Lipitor 20 mg daily.  Good luck with shoulder surgery and follow-up here in March

## 2018-08-03 NOTE — Progress Notes (Signed)
   Subjective:    Patient ID: [name], female    DOB: 1955/10/11, 62 y.o.   MRN: [id number]  HPI 62 year old Female in today to follow-up on hyperlipidemia.  She currently is on Lipitor 20 mg daily.  Total cholesterol is 201 and previously was 200 in June.  LDL cholesterol is 125 and was 125 in June.  HDL is low at 45.  Triglycerides of actually increased a little from 170 in June to 183 now.  Patient has been undergoing evaluation for left shoulder pain.  Was found to have a rotator cuff tear and is scheduled for surgery in late December.  She is asking about an EKG.  She had a normal EKG at age 40 in 2006.  Explained to her that she is likely low risk and that Anesthesia will decide if an EKG is necessary before surgery.  She has no chest pain or shortness of breath.  We discussed increasing lipid-lowering medication but she prefers to hold off at this point in time.  We will continue same dose for now and follow-up after her surgery in March giving her time for recovery and to get back into an exercise regimen.  She is taking Flexeril for pain.  She does not want narcotic pain medication at this point in time.    Review of Systems see above     Objective:   Physical Exam  Spent 15 minutes face-to-face speaking with her about hyperlipidemia, management, encourage diet and walking.  Discussion about dose of lipid-lowering medication.  Answer questions about EKG.      Assessment & Plan:  Mixed hyperlipidemia  Left rotator cuff tear with surgery scheduled late December  Plan: Continue same dose of Lipitor and follow-up here in March.

## 2018-08-18 ENCOUNTER — Telehealth: Payer: Self-pay | Admitting: Internal Medicine

## 2018-08-18 MED ORDER — CYCLOBENZAPRINE HCL 10 MG PO TABS: ORAL_TABLET | ORAL | 5 refills | Status: DC

## 2018-08-18 NOTE — Telephone Encounter (Signed)
Refill x 6 months 

## 2018-08-18 NOTE — Telephone Encounter (Signed)
Calling to request refill on Flexeril.  She has about 11-12 left.  However, surgery is not until 12/31 and she feels she may need a refill prior to surgery.    Pharmacy:  [facility] in [place].    Phone:  [phone]  Thank you.

## 2018-08-18 NOTE — Telephone Encounter (Signed)
Araceli, would you e-scribe for 6 months per Dr. Verlene Mayer order?  Thank you.

## 2018-09-07 DIAGNOSIS — Z4889 Encounter for other specified surgical aftercare: Secondary | ICD-10-CM | POA: Diagnosis not present

## 2018-09-07 DIAGNOSIS — X58XXXA Exposure to other specified factors, initial encounter: Secondary | ICD-10-CM | POA: Diagnosis not present

## 2018-09-07 DIAGNOSIS — M7521 Bicipital tendinitis, right shoulder: Secondary | ICD-10-CM | POA: Diagnosis not present

## 2018-09-07 DIAGNOSIS — G8918 Other acute postprocedural pain: Secondary | ICD-10-CM | POA: Diagnosis not present

## 2018-09-07 DIAGNOSIS — M19011 Primary osteoarthritis, right shoulder: Secondary | ICD-10-CM | POA: Diagnosis not present

## 2018-09-07 DIAGNOSIS — M659 Synovitis and tenosynovitis, unspecified: Secondary | ICD-10-CM | POA: Diagnosis not present

## 2018-09-07 DIAGNOSIS — M7501 Adhesive capsulitis of right shoulder: Secondary | ICD-10-CM | POA: Diagnosis not present

## 2018-09-07 DIAGNOSIS — M7541 Impingement syndrome of right shoulder: Secondary | ICD-10-CM | POA: Diagnosis not present

## 2018-09-07 DIAGNOSIS — M25511 Pain in right shoulder: Secondary | ICD-10-CM | POA: Diagnosis not present

## 2018-09-07 DIAGNOSIS — S46011A Strain of muscle(s) and tendon(s) of the rotator cuff of right shoulder, initial encounter: Secondary | ICD-10-CM | POA: Diagnosis not present

## 2018-09-07 DIAGNOSIS — S43491D Other sprain of right shoulder joint, subsequent encounter: Secondary | ICD-10-CM | POA: Diagnosis not present

## 2018-09-07 DIAGNOSIS — I890 Lymphedema, not elsewhere classified: Secondary | ICD-10-CM | POA: Diagnosis not present

## 2018-09-07 DIAGNOSIS — Y999 Unspecified external cause status: Secondary | ICD-10-CM | POA: Diagnosis not present

## 2018-09-07 DIAGNOSIS — S43491A Other sprain of right shoulder joint, initial encounter: Secondary | ICD-10-CM | POA: Diagnosis not present

## 2018-09-08 DIAGNOSIS — M25511 Pain in right shoulder: Secondary | ICD-10-CM | POA: Diagnosis not present

## 2018-09-08 DIAGNOSIS — Z4889 Encounter for other specified surgical aftercare: Secondary | ICD-10-CM | POA: Diagnosis not present

## 2018-09-08 DIAGNOSIS — I890 Lymphedema, not elsewhere classified: Secondary | ICD-10-CM | POA: Diagnosis not present

## 2018-09-08 DIAGNOSIS — M7541 Impingement syndrome of right shoulder: Secondary | ICD-10-CM | POA: Diagnosis not present

## 2018-09-17 DIAGNOSIS — M25611 Stiffness of right shoulder, not elsewhere classified: Secondary | ICD-10-CM | POA: Diagnosis not present

## 2018-09-17 DIAGNOSIS — M25511 Pain in right shoulder: Secondary | ICD-10-CM | POA: Diagnosis not present

## 2018-09-22 DIAGNOSIS — Z4889 Encounter for other specified surgical aftercare: Secondary | ICD-10-CM | POA: Diagnosis not present

## 2018-09-22 DIAGNOSIS — I890 Lymphedema, not elsewhere classified: Secondary | ICD-10-CM | POA: Diagnosis not present

## 2018-09-22 DIAGNOSIS — M7541 Impingement syndrome of right shoulder: Secondary | ICD-10-CM | POA: Diagnosis not present

## 2018-09-23 DIAGNOSIS — M25511 Pain in right shoulder: Secondary | ICD-10-CM | POA: Diagnosis not present

## 2018-09-23 DIAGNOSIS — M25611 Stiffness of right shoulder, not elsewhere classified: Secondary | ICD-10-CM | POA: Diagnosis not present

## 2018-09-30 DIAGNOSIS — M25611 Stiffness of right shoulder, not elsewhere classified: Secondary | ICD-10-CM | POA: Diagnosis not present

## 2018-09-30 DIAGNOSIS — M25511 Pain in right shoulder: Secondary | ICD-10-CM | POA: Diagnosis not present

## 2018-10-21 DIAGNOSIS — M25611 Stiffness of right shoulder, not elsewhere classified: Secondary | ICD-10-CM | POA: Diagnosis not present

## 2018-10-25 DIAGNOSIS — M25611 Stiffness of right shoulder, not elsewhere classified: Secondary | ICD-10-CM | POA: Diagnosis not present

## 2018-11-04 DIAGNOSIS — M25511 Pain in right shoulder: Secondary | ICD-10-CM | POA: Diagnosis not present

## 2018-11-08 DIAGNOSIS — M25611 Stiffness of right shoulder, not elsewhere classified: Secondary | ICD-10-CM | POA: Diagnosis not present

## 2018-11-11 DIAGNOSIS — M25611 Stiffness of right shoulder, not elsewhere classified: Secondary | ICD-10-CM | POA: Diagnosis not present

## 2018-12-06 ENCOUNTER — Telehealth: Payer: Self-pay | Admitting: Internal Medicine

## 2018-12-06 DIAGNOSIS — M25532 Pain in left wrist: Secondary | ICD-10-CM | POA: Diagnosis not present

## 2018-12-06 NOTE — Telephone Encounter (Signed)
She should call her orthopedist for advice. They can Xray there.

## 2018-12-06 NOTE — Telephone Encounter (Signed)
Jaslyn Bansal [phone]  Luanne called to say she fell on her Left wrist and hand yesterday playing basketball with her granddaughter. He wrist is swollen , hurts and some numbness. She wanted to see if you could order a xray or does she need to be seen first.

## 2018-12-06 NOTE — Telephone Encounter (Signed)
Caragh called back, I let her know she needs to call her ortho doctor and go from there. She stated she would do that,

## 2018-12-07 ENCOUNTER — Other Ambulatory Visit: Payer: BLUE CROSS/BLUE SHIELD | Admitting: Internal Medicine

## 2018-12-09 ENCOUNTER — Ambulatory Visit: Payer: BLUE CROSS/BLUE SHIELD | Admitting: Internal Medicine

## 2019-04-04 ENCOUNTER — Other Ambulatory Visit: Payer: Self-pay | Admitting: Internal Medicine

## 2019-05-03 ENCOUNTER — Other Ambulatory Visit: Payer: Self-pay

## 2019-05-03 ENCOUNTER — Other Ambulatory Visit: Payer: BC Managed Care – PPO | Admitting: Internal Medicine

## 2019-05-03 DIAGNOSIS — E782 Mixed hyperlipidemia: Secondary | ICD-10-CM | POA: Diagnosis not present

## 2019-05-03 DIAGNOSIS — Z0000 Encounter for general adult medical examination without abnormal findings: Secondary | ICD-10-CM | POA: Diagnosis not present

## 2019-05-03 DIAGNOSIS — E559 Vitamin D deficiency, unspecified: Secondary | ICD-10-CM

## 2019-05-04 LAB — COMPLETE METABOLIC PANEL WITH GFR
AG Ratio: 1.8 (calc) (ref 1.0–2.5)
ALT: 27 U/L (ref 6–29)
AST: 22 U/L (ref 10–35)
Albumin: 4.5 g/dL (ref 3.6–5.1)
Alkaline phosphatase (APISO): 80 U/L (ref 37–153)
BUN: 16 mg/dL (ref 7–25)
CO2: 29 mmol/L (ref 20–32)
Calcium: 9.7 mg/dL (ref 8.6–10.4)
Chloride: 103 mmol/L (ref 98–110)
Creat: 0.88 mg/dL (ref 0.50–0.99)
GFR, Est African American: 80 mL/min/{1.73_m2} (ref >=60)
GFR, Est Non African American: 69 mL/min/{1.73_m2} (ref >=60)
Globulin: 2.5 g/dL (calc) (ref 1.9–3.7)
Glucose, Bld: 94 mg/dL (ref 65–99)
Potassium: 4.3 mmol/L (ref 3.5–5.3)
Sodium: 141 mmol/L (ref 135–146)
Total Bilirubin: 0.6 mg/dL (ref 0.2–1.2)
Total Protein: 7 g/dL (ref 6.1–8.1)

## 2019-05-04 LAB — TSH: TSH: 3.31 mIU/L (ref 0.40–4.50)

## 2019-05-04 LAB — CBC WITH DIFFERENTIAL/PLATELET
Absolute Monocytes: 435 cells/uL (ref 200–950)
Basophils Absolute: 41 cells/uL (ref 0–200)
Basophils Relative: 0.6 %
Eosinophils Absolute: 138 cells/uL (ref 15–500)
Eosinophils Relative: 2 %
HCT: 39.8 % (ref 35.0–45.0)
Hemoglobin: 12.9 g/dL (ref 11.7–15.5)
Lymphs Abs: 2712 cells/uL (ref 850–3900)
MCH: 28.2 pg (ref 27.0–33.0)
MCHC: 32.4 g/dL (ref 32.0–36.0)
MCV: 87.1 fL (ref 80.0–100.0)
MPV: 10.1 fL (ref 7.5–12.5)
Monocytes Relative: 6.3 %
Neutro Abs: 3574 cells/uL (ref 1500–7800)
Neutrophils Relative %: 51.8 %
Platelets: 309 10*3/uL (ref 140–400)
RBC: 4.57 10*6/uL (ref 3.80–5.10)
RDW: 13.3 % (ref 11.0–15.0)
Total Lymphocyte: 39.3 %
WBC: 6.9 10*3/uL (ref 3.8–10.8)

## 2019-05-04 LAB — VITAMIN D 25 HYDROXY (VIT D DEFICIENCY, FRACTURES): Vit D, 25-Hydroxy: 25 ng/mL — ABNORMAL LOW (ref 30–100)

## 2019-05-04 LAB — LIPID PANEL
Cholesterol: 203 mg/dL — ABNORMAL HIGH (ref <200)
HDL: 46 mg/dL — ABNORMAL LOW (ref >=50)
LDL Cholesterol (Calc): 127 mg/dL (calc) — ABNORMAL HIGH
Non-HDL Cholesterol (Calc): 157 mg/dL (calc) — ABNORMAL HIGH (ref <130)
Total CHOL/HDL Ratio: 4.4 (calc) (ref <5.0)
Triglycerides: 186 mg/dL — ABNORMAL HIGH (ref <150)

## 2019-05-05 ENCOUNTER — Telehealth: Payer: Self-pay | Admitting: Internal Medicine

## 2019-05-05 ENCOUNTER — Encounter: Payer: BC Managed Care – PPO | Admitting: Internal Medicine

## 2019-05-05 NOTE — Telephone Encounter (Signed)
Attempted to call pt about CPE appt at @pm  today. Call made at 2:45pm and pt came in around same time thinking appt was at 3 pm. Did not leave message. Labs have been done and CPE will need to be re-scheduled at a later date.

## 2019-06-09 MED FILL — FLUARIX QUADRIVALENT 0.5 ML: 0.5 | 1 days supply | Qty: 1 | Fill #0

## 2019-07-05 ENCOUNTER — Encounter: Payer: Self-pay | Admitting: Internal Medicine

## 2019-07-05 ENCOUNTER — Ambulatory Visit (INDEPENDENT_AMBULATORY_CARE_PROVIDER_SITE_OTHER): Payer: BC Managed Care – PPO | Admitting: Internal Medicine

## 2019-07-05 ENCOUNTER — Other Ambulatory Visit: Payer: Self-pay

## 2019-07-05 VITALS — BP 120/80 | HR 84 | Temp 98.0°F | Ht 64.5 in | Wt 210.0 lb

## 2019-07-05 DIAGNOSIS — M7918 Myalgia, other site: Secondary | ICD-10-CM

## 2019-07-05 DIAGNOSIS — M25551 Pain in right hip: Secondary | ICD-10-CM

## 2019-07-05 DIAGNOSIS — Z8739 Personal history of other diseases of the musculoskeletal system and connective tissue: Secondary | ICD-10-CM | POA: Diagnosis not present

## 2019-07-05 DIAGNOSIS — Z0000 Encounter for general adult medical examination without abnormal findings: Secondary | ICD-10-CM | POA: Diagnosis not present

## 2019-07-05 DIAGNOSIS — E559 Vitamin D deficiency, unspecified: Secondary | ICD-10-CM | POA: Diagnosis not present

## 2019-07-05 DIAGNOSIS — Z8709 Personal history of other diseases of the respiratory system: Secondary | ICD-10-CM

## 2019-07-05 DIAGNOSIS — M7061 Trochanteric bursitis, right hip: Secondary | ICD-10-CM

## 2019-07-05 DIAGNOSIS — E782 Mixed hyperlipidemia: Secondary | ICD-10-CM

## 2019-07-05 DIAGNOSIS — K219 Gastro-esophageal reflux disease without esophagitis: Secondary | ICD-10-CM

## 2019-07-05 LAB — POCT URINALYSIS DIPSTICK
Appearance: NEGATIVE
Bilirubin, UA: NEGATIVE
Blood, UA: NEGATIVE
Glucose, UA: NEGATIVE
Ketones, UA: NEGATIVE
Leukocytes, UA: NEGATIVE
Nitrite, UA: NEGATIVE
Odor: NEGATIVE
Protein, UA: NEGATIVE
Spec Grav, UA: 1.01 (ref 1.010–1.025)
Urobilinogen, UA: 0.2 E.U./dL
pH, UA: 6.5 (ref 5.0–8.0)

## 2019-07-05 MED ORDER — ROSUVASTATIN CALCIUM 10 MG PO TABS: 10.0000 mg | ORAL_TABLET | Freq: Every day | ORAL | 1 refills | Status: DC

## 2019-07-05 NOTE — Progress Notes (Addendum)
Subjective:    Patient ID: [name], female    DOB: 09-13-1955, 63 y.o.   MRN: [id number]  HPI  63 year old Female for health maintenance exam and evaluation of medical issues.    Had right shoulder arthroscopy Dec 2019. Doing well.  Recently has had issues with right hip pain.  Does not recall any injury.  Has tried muscle relaxants with some relief and anti-inflammatory medication over-the-counter.  Had colonoscopy in 2014   Right shoulder arthroscopy December 2019  Bilateral carpal tunnel release 2016  Cervical Disectomy 2013  History of hyperlipidemia.  History of recurrent contact dermatitis/poison ivy.  She has a history of osteopenia, GE reflux and asthma.  She is allergic to bee stings.  She has been allergy tested with skin test showing minimal positivity for mold.  Spirometry showed significant postbronchodilator improvement.  She had a false positive HIV test done at the [facility] October 2000 and is no longer lamp exam blood.  Your vitamin D level was low at 25.  It has remained low for several years.  She could benefit from taking 50,000 units weekly.  Left lower lobe pneumonia March 2006.  Sees [facility] for GYN care and has mammogram there.  History of cervical disc disease with bulge C7-T1.  An anterior cervical discectomy and decompression with fusion for right C8 radiculopathy done by Dr. Vertell Limber in 2013.   She has a history of hyperlipidemia and began taking statin therapy in 2015.  I am changing her statin medication from Lipitor to Crestor today due to elevated triglycerides of 186, LDL cholesterol of 127, total cholesterol 203.  There have not been significant change in the past year on current dose of Lipitor so she is being changed to Crestor 10 mg daily.  She will follow-up after Christmas with lipid panel and liver functions with office visit.  Old records indicates she had a total cholesterol of 241 in 2006 with triglycerides of 199 and an LDL  cholesterol of 146 but it was not until 2015 and I convinced her to start statin therapy.  Colonoscopy 2014 by Dr. Olevia Perches with an adenomatous polyp noted.  Was told to follow-up in 10 years.  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  Likes working full-time.  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  She is married.  Does not smoke or consume alcohol.  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   Family history: Father's family history is not known.  Mother with history of hypertension hyperlipidemia and cardiac arrhythmia.  1 brother overweight with history of alcoholism.  Another brother with history of smoking.  1 brother in good health.  No sisters.  She has 2 children a son and daughter both adults in good health.           Review of Systems  has eye exam recently and had mammogram scheduled. Sees [facility] and has mammogram there     Objective:   Physical Exam Blood pressure 120/80, pulse 84 temperature 98 degrees, pulse oximetry 98% BMI 35.49 weight 210 pounds.  Skin warm and dry.  Nodes none.  TMs are clear.  Pharynx not examined due to the pandemic.  Neck is supple without JVD thyromegaly or carotid bruits.  Chest clear to auscultation without rales or wheezing.  Breasts normal female without masses.  Cardiac exam regular rate  and rhythm normal S1 and S2.  Abdomen soft nondistended without hepatosplenomegaly masses or tenderness.  No lower extremity pitting edema.  Neuro intact without gross Focal deficits.  Behavior thought and judgment normal.       Assessment & Plan:  Episodic right hip pain-likely trochanteric bursitis may take anti-inflammatory medication.  Could consider injection and/or hip x-ray  Hyperlipidemia mixed-change Lipitor to Crestor 10 mg daily.  Previously Lipitor was increased from 10 to 20 mg daily in 2019.  Encourage diet exercise and weight loss.   BMI 35.49  History of asthma with no recent exacerbations  History of recurrent poison ivy  History of vitamin D deficiency-take vitamin D supplement regularly  GE reflux currently not taking chronic PPI  Allergic rhinitis and has seen allergist in the past  Plan: Return in January for follow-up on hyperlipidemia.  Had flu vaccine 06/09/2019

## 2019-07-05 NOTE — Patient Instructions (Addendum)
Change Lipitor to Crestor and follow-up in January.  Watch diet try to exercise and lose weight due to elevated BMI.  May take Flexeril for musculoskeletal pain and anti-inflammatory medication sparingly.

## 2019-07-18 ENCOUNTER — Telehealth: Payer: Self-pay | Admitting: Internal Medicine

## 2019-07-18 NOTE — Telephone Encounter (Signed)
She may go get tested at test center. No order needed.

## 2019-07-18 NOTE — Telephone Encounter (Signed)
Called [name] back and she verbalized understanding

## 2019-07-18 NOTE — Telephone Encounter (Signed)
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 He was feeling fine then but was sick over the weekend, went today and was tested and it came back positive for COVID, so now she is worried about the people she was around this weekend. She has no symptoms at this time. What should she do?

## 2019-08-02 ENCOUNTER — Telehealth: Payer: Self-pay

## 2019-08-02 NOTE — Telephone Encounter (Signed)
Call her back. My note says to take supplement regularly. It can be obtained OTC. I don't remember suggesting high dose Vitamin D weekly but see if she recalls that. I  suggest she take 4000 units daily OTC

## 2019-08-02 NOTE — Telephone Encounter (Signed)
Patient called wasn't sure whether you told her you were calling in vit d to her pharmacy.

## 2019-08-03 NOTE — Telephone Encounter (Signed)
Left detailed message.   

## 2019-08-17 DIAGNOSIS — Z1231 Encounter for screening mammogram for malignant neoplasm of breast: Secondary | ICD-10-CM | POA: Diagnosis not present

## 2019-08-17 DIAGNOSIS — Z01419 Encounter for gynecological examination (general) (routine) without abnormal findings: Secondary | ICD-10-CM | POA: Diagnosis not present

## 2019-08-17 DIAGNOSIS — Z1151 Encounter for screening for human papillomavirus (HPV): Secondary | ICD-10-CM | POA: Diagnosis not present

## 2019-08-17 DIAGNOSIS — Z6835 Body mass index (BMI) 35.0-35.9, adult: Secondary | ICD-10-CM | POA: Diagnosis not present

## 2019-08-17 LAB — HM MAMMOGRAPHY

## 2019-08-18 ENCOUNTER — Encounter: Payer: Self-pay | Admitting: Internal Medicine

## 2019-10-04 ENCOUNTER — Other Ambulatory Visit: Payer: BC Managed Care – PPO | Admitting: Internal Medicine

## 2019-10-04 ENCOUNTER — Other Ambulatory Visit: Payer: Self-pay

## 2019-10-04 DIAGNOSIS — E782 Mixed hyperlipidemia: Secondary | ICD-10-CM | POA: Diagnosis not present

## 2019-10-04 LAB — LIPID PANEL
Cholesterol: 163 mg/dL (ref <200)
HDL: 51 mg/dL (ref >=50)
LDL Cholesterol (Calc): 85 mg/dL (calc)
Non-HDL Cholesterol (Calc): 112 mg/dL (calc) (ref <130)
Total CHOL/HDL Ratio: 3.2 (calc) (ref <5.0)
Triglycerides: 168 mg/dL — ABNORMAL HIGH (ref <150)

## 2019-10-04 LAB — HEPATIC FUNCTION PANEL
AG Ratio: 1.9 (calc) (ref 1.0–2.5)
ALT: 20 U/L (ref 6–29)
AST: 19 U/L (ref 10–35)
Albumin: 4.5 g/dL (ref 3.6–5.1)
Alkaline phosphatase (APISO): 69 U/L (ref 37–153)
Bilirubin, Direct: 0.1 mg/dL (ref 0.0–0.2)
Globulin: 2.4 g/dL (calc) (ref 1.9–3.7)
Indirect Bilirubin: 0.5 mg/dL (calc) (ref 0.2–1.2)
Total Bilirubin: 0.6 mg/dL (ref 0.2–1.2)
Total Protein: 6.9 g/dL (ref 6.1–8.1)

## 2019-10-06 ENCOUNTER — Other Ambulatory Visit: Payer: Self-pay

## 2019-10-06 ENCOUNTER — Ambulatory Visit (INDEPENDENT_AMBULATORY_CARE_PROVIDER_SITE_OTHER): Payer: BC Managed Care – PPO | Admitting: Internal Medicine

## 2019-10-06 ENCOUNTER — Encounter: Payer: Self-pay | Admitting: Internal Medicine

## 2019-10-06 VITALS — BP 120/72 | HR 76 | Temp 98.3°F | Ht 64.5 in | Wt 212.0 lb

## 2019-10-06 DIAGNOSIS — E559 Vitamin D deficiency, unspecified: Secondary | ICD-10-CM

## 2019-10-06 DIAGNOSIS — Z8739 Personal history of other diseases of the musculoskeletal system and connective tissue: Secondary | ICD-10-CM

## 2019-10-06 DIAGNOSIS — E782 Mixed hyperlipidemia: Secondary | ICD-10-CM

## 2019-10-06 MED ORDER — ONDANSETRON HCL 4 MG PO TABS: 4.0000 mg | ORAL_TABLET | Freq: Three times a day (TID) | ORAL | 0 refills | Status: DC | PRN

## 2019-10-06 NOTE — Progress Notes (Signed)
   Subjective:    Patient ID: [name], female    DOB: Apr 24, 1956, 64 y.o.   MRN: [id number]  HPI 64 year old Female in today for follow-up of mixed hyperlipidemia.  Patient currently on Crestor 10 mg daily.  In August 2020 total cholesterol was 203, HDL low at 46 and triglycerides 186 with an LDL of 127.  She had health maintenance exam in October.  I changed her from Lipitor to Crestor because of no significant change in the past year on current dose of Lipitor.  Lipids have improved with Crestor.  Total cholesterol is now 163, HDL 51 triglycerides 168 and LDL normal at 85.  I am pleased with his progress and she will continue with this medication.  She does not exercise all that much at the present time but has a long driveway that she could get a good deal of exercise from on a daily basis.  She is working remotely from home all but 1 day a week.  Son has had some issues with anxiety that is concerning to her.  He will be seen soon to [facility].  He is living at home with patient and her husband.    Review of Systems has occasional migraine headache for which she takes Zofran and this was refilled today at her request.  Handling the pandemic fairly well.  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     Objective:   Physical Exam Patient not examined but spent 15 minutes speaking with her about these issues.       Assessment & Plan:  Mixed hyperlipidemia-significant improvement switching Lipitor to Crestor 10 mg daily.  Continue this medication.  Try to walk some daily and watch diet.  Follow-up in October 2021  Health maintenance-had flu vaccine through employment.  Has had 1 pneumococcal 23 vaccine.  Tetanus immunization is up-to-date.  Colonoscopy due in 2024.  History of occasional migraine type headache.  Refill Zofran at her request.  History of right shoulder impingement seen by Dr. Onnie Graham  Situational  stress with son  Plan: Return in October 2021 for health maintenance exam or as needed.  Zofran refilled at her request.

## 2019-10-06 NOTE — Patient Instructions (Addendum)
Continue Crestor 10 mg daily.  I am pleased with your lipid panel results.  Try to walk some daily.  Zofran refilled at patient request.  Watch diet.

## 2019-10-17 ENCOUNTER — Other Ambulatory Visit: Payer: Self-pay | Admitting: Internal Medicine

## 2019-10-17 MED ORDER — ONDANSETRON HCL 4 MG PO TABS: 4.0000 mg | ORAL_TABLET | Freq: Three times a day (TID) | ORAL | 0 refills | Status: DC | PRN

## 2019-10-17 NOTE — Telephone Encounter (Signed)
Dietrich Skeeters [phone]  promethazine (PHENERGAN) injection 6.25-12.5 mg   Jesikah called to say she had some nausea over the weekend and went to take some of her above medication and realize that she only had a few left. So she would like to get a refill. Last time filled was in 2016.

## 2019-11-26 IMAGING — CR DG HUMERUS 2V *R*
2 series · 2 of 2 positions shown · non-contrast
Comparison: None.

CLINICAL DATA: RIGHT humerus pain for 6 weeks.  No trauma.

EXAM:
RIGHT HUMERUS - 2+ VIEW

[w humerus ap right]
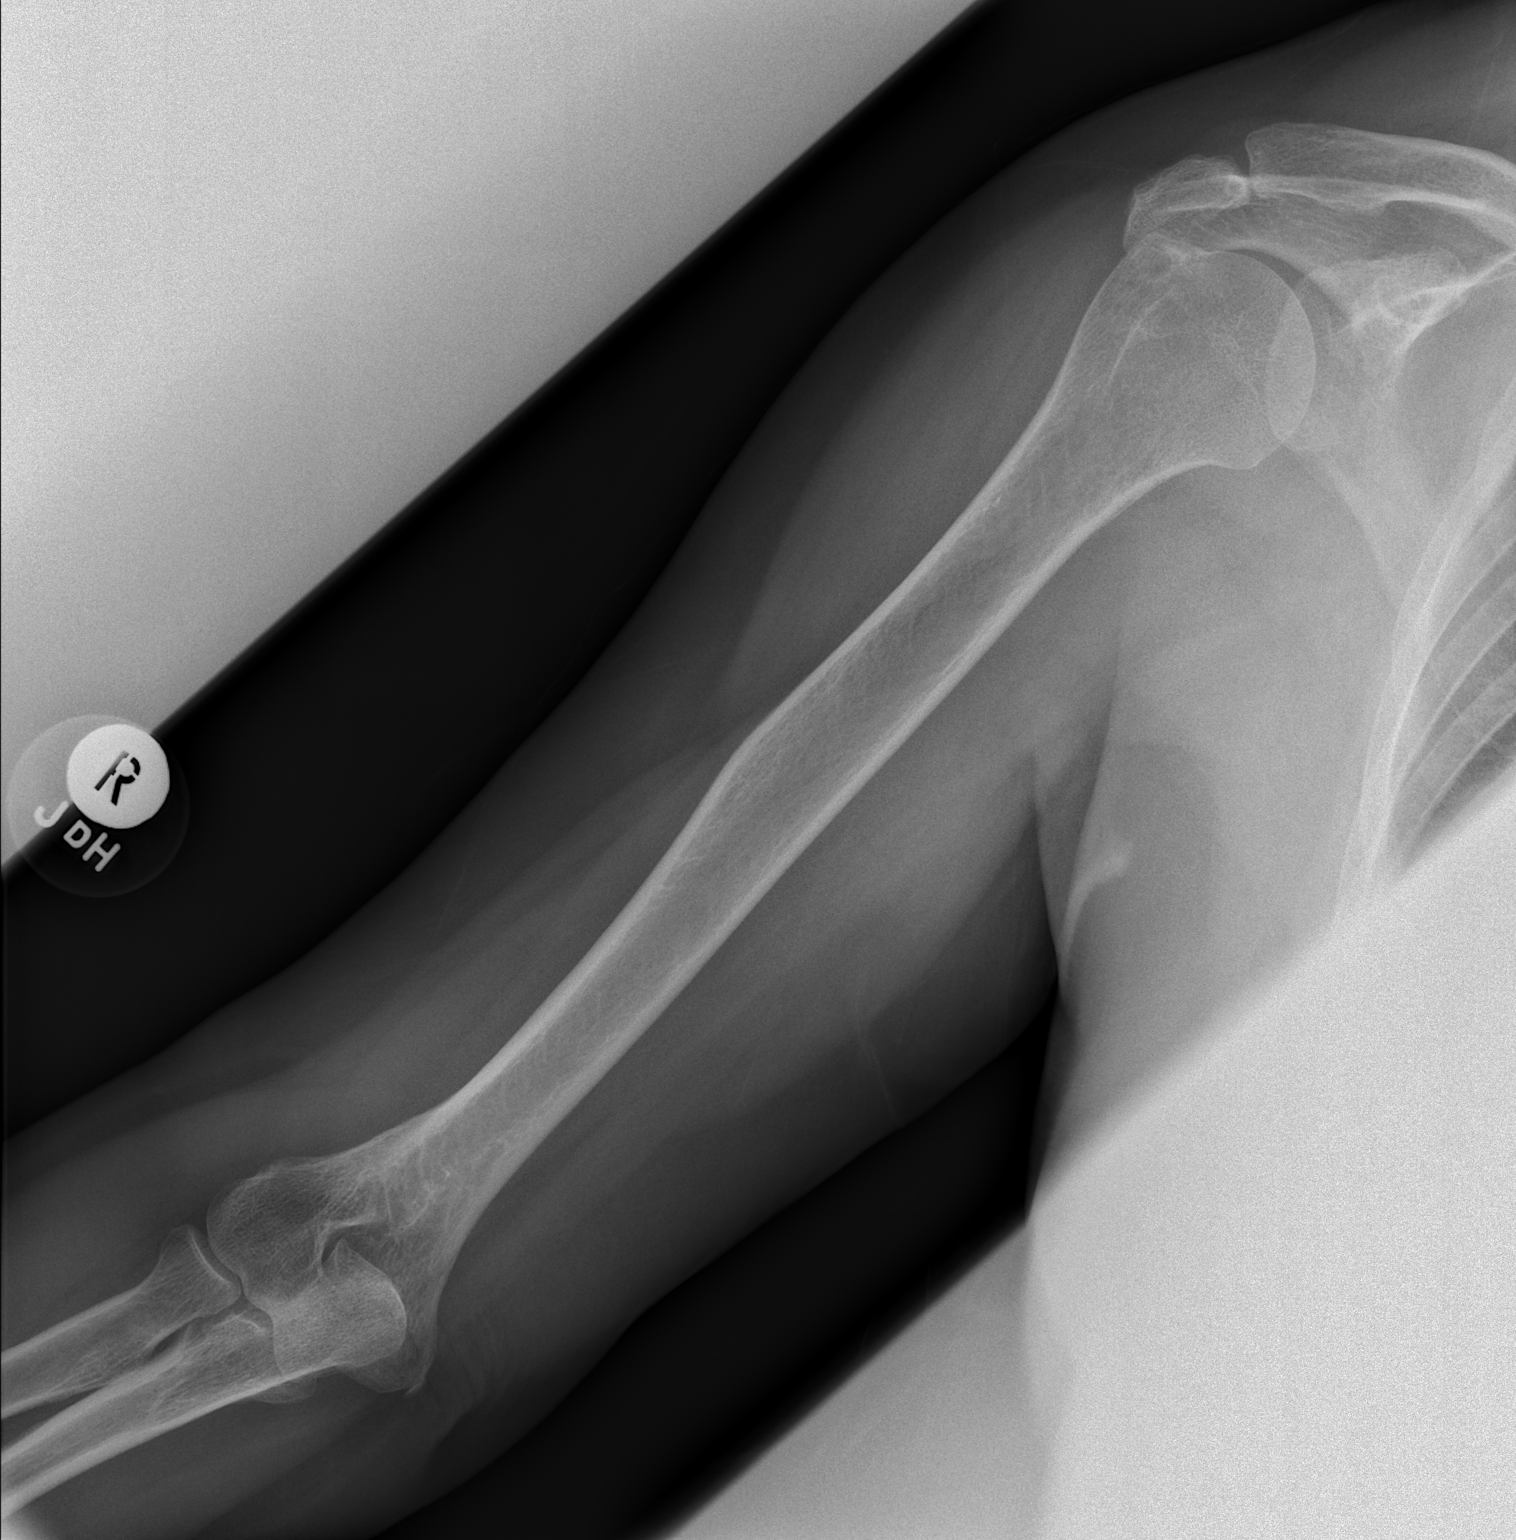

[w humerus lat right]
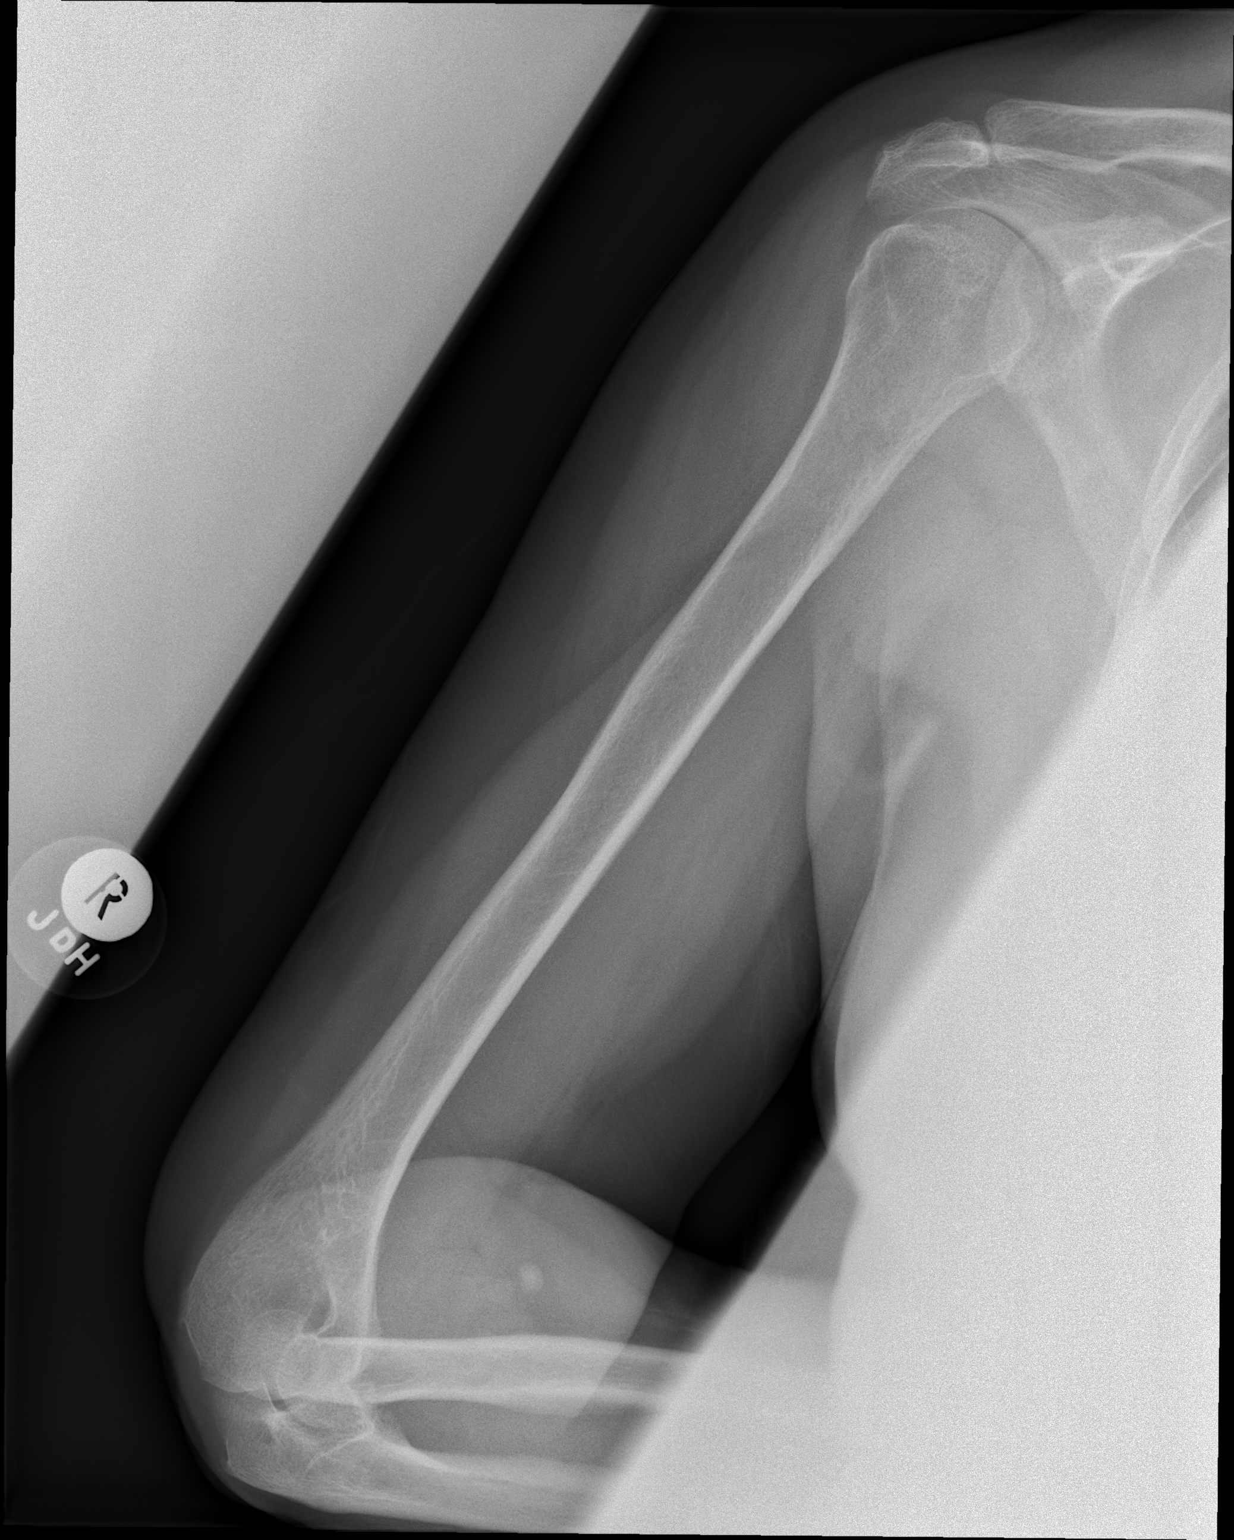

[2 of 2 positions shown; findings below may reference images not displayed]

FINDINGS: There is no evidence of fracture or other focal bone lesions. Soft
tissues are unremarkable.
IMPRESSION: Negative.

## 2019-12-14 ENCOUNTER — Other Ambulatory Visit: Payer: Self-pay | Admitting: Internal Medicine

## 2020-05-07 ENCOUNTER — Other Ambulatory Visit: Payer: Self-pay | Admitting: Internal Medicine

## 2020-06-06 ENCOUNTER — Other Ambulatory Visit: Payer: Self-pay

## 2020-06-06 DIAGNOSIS — Z1329 Encounter for screening for other suspected endocrine disorder: Secondary | ICD-10-CM

## 2020-06-06 DIAGNOSIS — M8580 Other specified disorders of bone density and structure, unspecified site: Secondary | ICD-10-CM

## 2020-06-06 DIAGNOSIS — E782 Mixed hyperlipidemia: Secondary | ICD-10-CM

## 2020-06-06 DIAGNOSIS — Z0000 Encounter for general adult medical examination without abnormal findings: Secondary | ICD-10-CM

## 2020-06-06 DIAGNOSIS — E559 Vitamin D deficiency, unspecified: Secondary | ICD-10-CM

## 2020-07-06 ENCOUNTER — Other Ambulatory Visit: Payer: BC Managed Care – PPO | Admitting: Internal Medicine

## 2020-07-06 ENCOUNTER — Other Ambulatory Visit: Payer: Self-pay

## 2020-07-07 LAB — CBC WITH DIFFERENTIAL/PLATELET
Absolute Monocytes: 419 cells/uL (ref 200–950)
Basophils Absolute: 71 cells/uL (ref 0–200)
Basophils Relative: 1 %
Eosinophils Absolute: 99 cells/uL (ref 15–500)
Eosinophils Relative: 1.4 %
HCT: 38.4 % (ref 35.0–45.0)
Hemoglobin: 12.8 g/dL (ref 11.7–15.5)
Lymphs Abs: 2592 cells/uL (ref 850–3900)
MCH: 29.7 pg (ref 27.0–33.0)
MCHC: 33.3 g/dL (ref 32.0–36.0)
MCV: 89.1 fL (ref 80.0–100.0)
MPV: 10.5 fL (ref 7.5–12.5)
Monocytes Relative: 5.9 %
Neutro Abs: 3919 cells/uL (ref 1500–7800)
Neutrophils Relative %: 55.2 %
Platelets: 291 10*3/uL (ref 140–400)
RBC: 4.31 10*6/uL (ref 3.80–5.10)
RDW: 13.1 % (ref 11.0–15.0)
Total Lymphocyte: 36.5 %
WBC: 7.1 10*3/uL (ref 3.8–10.8)

## 2020-07-07 LAB — LIPID PANEL
Cholesterol: 189 mg/dL (ref <200)
HDL: 52 mg/dL (ref >=50)
LDL Cholesterol (Calc): 107 mg/dL (calc) — ABNORMAL HIGH
Non-HDL Cholesterol (Calc): 137 mg/dL (calc) — ABNORMAL HIGH (ref <130)
Total CHOL/HDL Ratio: 3.6 (calc) (ref <5.0)
Triglycerides: 179 mg/dL — ABNORMAL HIGH (ref <150)

## 2020-07-07 LAB — COMPLETE METABOLIC PANEL WITH GFR
AG Ratio: 1.7 (calc) (ref 1.0–2.5)
ALT: 22 U/L (ref 6–29)
AST: 21 U/L (ref 10–35)
Albumin: 4.6 g/dL (ref 3.6–5.1)
Alkaline phosphatase (APISO): 68 U/L (ref 37–153)
BUN: 16 mg/dL (ref 7–25)
CO2: 26 mmol/L (ref 20–32)
Calcium: 9.7 mg/dL (ref 8.6–10.4)
Chloride: 105 mmol/L (ref 98–110)
Creat: 0.87 mg/dL (ref 0.50–0.99)
GFR, Est African American: 81 mL/min/{1.73_m2} (ref >=60)
GFR, Est Non African American: 70 mL/min/{1.73_m2} (ref >=60)
Globulin: 2.7 g/dL (calc) (ref 1.9–3.7)
Glucose, Bld: 98 mg/dL (ref 65–99)
Potassium: 4.2 mmol/L (ref 3.5–5.3)
Sodium: 142 mmol/L (ref 135–146)
Total Bilirubin: 0.7 mg/dL (ref 0.2–1.2)
Total Protein: 7.3 g/dL (ref 6.1–8.1)

## 2020-07-07 LAB — TSH: TSH: 3.15 mIU/L (ref 0.40–4.50)

## 2020-07-07 LAB — VITAMIN D 25 HYDROXY (VIT D DEFICIENCY, FRACTURES): Vit D, 25-Hydroxy: 32 ng/mL (ref 30–100)

## 2020-07-10 ENCOUNTER — Ambulatory Visit (INDEPENDENT_AMBULATORY_CARE_PROVIDER_SITE_OTHER): Payer: BC Managed Care – PPO | Admitting: Internal Medicine

## 2020-07-10 ENCOUNTER — Encounter: Payer: Self-pay | Admitting: Internal Medicine

## 2020-07-10 ENCOUNTER — Other Ambulatory Visit: Payer: Self-pay

## 2020-07-10 VITALS — BP 138/80 | HR 80 | Ht 65.0 in | Wt 213.0 lb

## 2020-07-10 DIAGNOSIS — Z8709 Personal history of other diseases of the respiratory system: Secondary | ICD-10-CM | POA: Diagnosis not present

## 2020-07-10 DIAGNOSIS — M8580 Other specified disorders of bone density and structure, unspecified site: Secondary | ICD-10-CM

## 2020-07-10 DIAGNOSIS — E782 Mixed hyperlipidemia: Secondary | ICD-10-CM | POA: Diagnosis not present

## 2020-07-10 DIAGNOSIS — Z6835 Body mass index (BMI) 35.0-35.9, adult: Secondary | ICD-10-CM

## 2020-07-10 DIAGNOSIS — Z0000 Encounter for general adult medical examination without abnormal findings: Secondary | ICD-10-CM | POA: Diagnosis not present

## 2020-07-10 DIAGNOSIS — M7918 Myalgia, other site: Secondary | ICD-10-CM | POA: Diagnosis not present

## 2020-07-10 DIAGNOSIS — K219 Gastro-esophageal reflux disease without esophagitis: Secondary | ICD-10-CM

## 2020-07-10 LAB — POCT URINALYSIS DIPSTICK
Appearance: NEGATIVE
Bilirubin, UA: NEGATIVE
Blood, UA: NEGATIVE
Glucose, UA: NEGATIVE
Ketones, UA: NEGATIVE
Leukocytes, UA: NEGATIVE
Nitrite, UA: NEGATIVE
Odor: NEGATIVE
Protein, UA: NEGATIVE
Spec Grav, UA: 1.015 (ref 1.010–1.025)
Urobilinogen, UA: 0.2 E.U./dL
pH, UA: 6 (ref 5.0–8.0)

## 2020-07-10 NOTE — Progress Notes (Addendum)
Subjective:    Patient ID: [name], female    DOB: 01-13-1956, 64 y.o.   MRN: [id number]  HPI  64 year old Female for health maintenance exam and evaluation of medical issues.  Patient continues to work full-time but is working from home.  Pandemic has been difficult for her.  She has had family members affected.  All in all her labs are pretty good considering.  She has elevation of triglycerides at 179 and mild elevation of LDL cholesterol of 107.  CBC, TSH, c-Met are normal.  Vitamin D level is low normal at 32.  She does take vitamin D supplementation and can increase that.  She is on Crestor for mixed hyperlipidemia 10 mg daily.  Has had musculoskeletal pain from time to time for which she took Flexeril.  History of occasional migraine headaches treated with Zofran.  History of right shoulder impingement seen by Dr. Onnie Graham.  Had right shoulder arthroscopy December 2019 and did well.  Had colonoscopy by Dr. Olevia Perches in 2014 with 10-year follow-up recommended.  Bilateral carpal tunnel release 2016.  She is allergic to bee stings.  She has been allergy tested in the past showing minimal positivity for mold.  Spirometry showed significant postbronchodilator improvement.  She had a false positive HIV test done in the [facility] in October 2000 and unfortunately is no longer able to donate blood.  History of left lower lobe pneumonia March 2006.  History of cervical disc disease with bulge C7-T1.  Had anterior cervical discectomy and decompression with fusion for right C8 radiculopathy done by Dr. Vertell Limber in 2013.  Began taking statin medication in 2015.  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  [place] working.  Has a college education and a Masters degree.  She is married.  Does not smoke or consume alcohol.  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  Family history: Father's family history is not known.  Mother with history of hypertension,  hyperlipidemia and cardiac arrhythmia.  3 brothers.  No sisters.  She has 2 children, a son and a daughter both adults in good health.   History of mild osteopenia with last bone density study done through this office was in 2016 and T score was -1.5.  Has had 3 COVID-19 vaccines.  Tetanus immunization is up-to-date.  Has had Shingrix series.  Has had high-dose flu vaccine.  Had Prevnar 23 in 2006.  Due for repeat as she is now 65 but we will defer this.   Review of Systems see above-mammogram done through [facility]     Objective:   Physical Exam Blood pressure 138/80 pulse 80 pulse oximetry 98% weight 213 pounds height 5 feet 5 inches BMI 35.45 TMs are clear.  Neck is supple.  No thyromegaly.  No adenopathy.  No carotid bruits.  Chest clear to auscultation without rales or wheezing.  Breast without masses.  Cardiac exam regular rate and rhythm normal S1 and S2 without murmurs or gallops.  Abdomen is soft, nondistended without hepatosplenomegaly masses or tenderness.  No lower extremity pitting edema.  Neuro intact without focal deficits.  Affect thought and judgment are entirely normal.      Assessment & Plan:  Mixed hyperlipidemia-continue statin medication  History of musculoskeletal pain has been treated with Flexeril  History of migraine headaches  Some situational stress with family discussed today  BMI 35-work on diet and exercise  History of bee sting allergy  and mild asthma.  Currently asthma not symptomatic.  Not using inhaler at the present time  Osteopenia-deferred to GYN but last bone density study several years ago showed T score of -1.5  Plan: Continue current medications.  Work on diet exercise and weight loss and follow-up in 1 year.  Pandemic has been difficult for her.  Situational stress with family discussed.  History of GE reflux

## 2020-08-04 ENCOUNTER — Encounter: Payer: Self-pay | Admitting: Internal Medicine

## 2020-08-04 NOTE — Patient Instructions (Addendum)
It was a pleasure to see you today.  Work on diet exercise and weight loss.  Continue current medications and follow-up in 1 year.  Please check with GYN office and see if bone density study is stable.  Last one we have on file was several years ago T score revealed mild osteopenia.  Continue with annual mammogram.  Flu vaccine is up-to-date.

## 2020-08-06 ENCOUNTER — Other Ambulatory Visit: Payer: Self-pay | Admitting: Internal Medicine

## 2021-01-08 ENCOUNTER — Other Ambulatory Visit: Payer: Self-pay

## 2021-01-08 ENCOUNTER — Other Ambulatory Visit: Payer: BC Managed Care – PPO | Admitting: Internal Medicine

## 2021-01-08 DIAGNOSIS — E782 Mixed hyperlipidemia: Secondary | ICD-10-CM

## 2021-01-08 LAB — HEPATIC FUNCTION PANEL
AG Ratio: 1.9 (calc) (ref 1.0–2.5)
ALT: 24 U/L (ref 6–29)
AST: 21 U/L (ref 10–35)
Albumin: 4.2 g/dL (ref 3.6–5.1)
Alkaline phosphatase (APISO): 60 U/L (ref 37–153)
Bilirubin, Direct: 0.1 mg/dL (ref 0.0–0.2)
Globulin: 2.2 g/dL (calc) (ref 1.9–3.7)
Indirect Bilirubin: 0.4 mg/dL (calc) (ref 0.2–1.2)
Total Bilirubin: 0.5 mg/dL (ref 0.2–1.2)
Total Protein: 6.4 g/dL (ref 6.1–8.1)

## 2021-01-08 LAB — LIPID PANEL
Cholesterol: 148 mg/dL (ref <200)
HDL: 41 mg/dL — ABNORMAL LOW (ref >=50)
LDL Cholesterol (Calc): 82 mg/dL (calc)
Non-HDL Cholesterol (Calc): 107 mg/dL (calc) (ref <130)
Total CHOL/HDL Ratio: 3.6 (calc) (ref <5.0)
Triglycerides: 150 mg/dL — ABNORMAL HIGH (ref <150)

## 2021-01-08 LAB — EXTRA LAV TOP TUBE

## 2021-01-10 ENCOUNTER — Other Ambulatory Visit: Payer: Self-pay

## 2021-01-10 ENCOUNTER — Ambulatory Visit (INDEPENDENT_AMBULATORY_CARE_PROVIDER_SITE_OTHER): Payer: BC Managed Care – PPO | Admitting: Internal Medicine

## 2021-01-10 ENCOUNTER — Encounter: Payer: Self-pay | Admitting: Internal Medicine

## 2021-01-10 VITALS — BP 110/70 | HR 78 | Ht 65.0 in | Wt 215.0 lb

## 2021-01-10 DIAGNOSIS — Z0000 Encounter for general adult medical examination without abnormal findings: Secondary | ICD-10-CM

## 2021-01-10 DIAGNOSIS — E782 Mixed hyperlipidemia: Secondary | ICD-10-CM | POA: Diagnosis not present

## 2021-01-10 DIAGNOSIS — M8580 Other specified disorders of bone density and structure, unspecified site: Secondary | ICD-10-CM

## 2021-01-10 DIAGNOSIS — H9193 Unspecified hearing loss, bilateral: Secondary | ICD-10-CM

## 2021-01-10 NOTE — Progress Notes (Signed)
   Subjective:    Patient ID: [name], female    DOB: [birth year], 65 y.o.   MRN: [id number]  HPI 25 year-year-old Female seen for 69-month follow-up.  She had health maintenance exam November 2021.    She has history of elevated triglycerides and mild elevation of LDL cholesterol.  This was noted on labs in November 2021.    Vitamin D level was low normal at 32 at that time.    She does take Crestor for mixed hyperlipidemia 10 mg daily.    History of occasional migraine headaches treated with Zofran.    Colonoscopy due in 2024, last done by Dr. Olevia Perches in 2014 and was normal.    She is allergic to bee stings.  History of allergy to mold and spirometry has shown significant improvement postbronchodilator use.    History of left lower lobe pneumonia 2006.    History of cervical disc disease with bulge C7-T1.  Had anterior cervical discectomy and decompression with fusion for right C8 radiculopathy by Dr. Vertell Limber in 2013.    Began taking statin medication in 2015.   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  Had bone density study May 12 ,2022 and has mild osteopenia with lowest T score -1.3.  History of right shoulder impingement seen by Dr. Onnie Graham.  Had right shoulder arthroscopy December 2019 and did well.  Had bilateral carpal tunnel release in 2016.  She is allergic to bee stings.  She has been allergy tested in the past showing minimal positivity for mold.  Spirometry showed significant postbronchodilator improvement.  She had a false positive HIV test done at the [facility] in October 2000 and unfortunately is no longer able to donate blood.  Her lipid panel done recently shows low HDL of 41 and previously was 52 in October 2021.  Total cholesterol is normal at 148.  Triglycerides 150, LDL cholesterol 82.  Liver functions no statin medication are normal.  Review of Systems see above-has some concerns about her hearing and would  like to have a hearing evaluation.  Has no ringing in her ears but feels that her hearing may be impaired.    Objective:   Physical Exam Blood pressure 110/70 pulse 78 pulse oximetry 98% weight 215 pounds height 5 feet 5 inches BMI 35.78  Skin warm and dry.  Nodes none.  TMs clear.  Neck is supple.  Chest is clear to auscultation without rales or wheezing.  Cardiac exam: Regular rate and rhythm normal S1 and S2.  No lower extremity pitting edema.  Hearing test done with audio scope II today is essentially normal.       Assessment & Plan:  Patient has concerns about her hearing-refer to [facility] ear nose and throat for hearing evaluation  Hyperlipidemia-stable on statin therapy-Crestor 10 mg daily  History of migraine headaches-has Zofran as needed for nausea  BMI 35.78-continue to work on diet exercise and weight loss  History of bee sting allergy and mild asthma  Osteopenia-bone density study on Jan 17, 2021 showed stable T score of -1.3  Plan: She will return in 6 months for health maintenance exam and evaluation of medical issues.

## 2021-01-10 NOTE — Patient Instructions (Addendum)
Referral to ENT regarding hearing loss.  Continue with statin medication.  Work on diet exercise and weight loss.  Bone density is stable.  Return in 6 months for health maintenance exam.

## 2021-01-11 LAB — LIPID PANEL

## 2021-01-11 LAB — HEPATIC FUNCTION PANEL

## 2021-01-14 ENCOUNTER — Other Ambulatory Visit: Payer: Self-pay | Admitting: Internal Medicine

## 2021-01-14 DIAGNOSIS — E782 Mixed hyperlipidemia: Secondary | ICD-10-CM

## 2021-01-14 DIAGNOSIS — M8580 Other specified disorders of bone density and structure, unspecified site: Secondary | ICD-10-CM

## 2021-01-14 DIAGNOSIS — Z0000 Encounter for general adult medical examination without abnormal findings: Secondary | ICD-10-CM

## 2021-01-17 ENCOUNTER — Ambulatory Visit
Admission: RE | Admit: 2021-01-17 | Discharge: 2021-01-17 | Disposition: A | Discharge status: Home / Self Care | Payer: BLUE CROSS/BLUE SHIELD | Source: Ambulatory Visit | Attending: Internal Medicine | Admitting: Internal Medicine

## 2021-01-17 ENCOUNTER — Other Ambulatory Visit: Payer: Self-pay

## 2021-03-14 ENCOUNTER — Telehealth: Payer: Self-pay

## 2021-03-14 ENCOUNTER — Other Ambulatory Visit: Payer: Self-pay

## 2021-03-14 ENCOUNTER — Telehealth (INDEPENDENT_AMBULATORY_CARE_PROVIDER_SITE_OTHER): Payer: BC Managed Care – PPO | Admitting: Internal Medicine

## 2021-03-14 DIAGNOSIS — U071 COVID-19: Secondary | ICD-10-CM

## 2021-03-14 MED ORDER — AZITHROMYCIN 250 MG PO TABS: ORAL_TABLET | ORAL | 0 refills | Status: AC

## 2021-03-14 NOTE — Telephone Encounter (Signed)
She said you home COVID test yesterday is was positive. Symptoms began on Tuesday she had a sore and a headache, yesterday morning she felt congested but last night was rough head congested and headache she had to sleep sitting up up. She would like to know what to do.  [phone]

## 2021-03-15 ENCOUNTER — Encounter: Payer: Self-pay | Admitting: Internal Medicine

## 2021-03-15 NOTE — Patient Instructions (Signed)
Take Zithromax Z-PAK 2 tabs day 1 followed by 1 tab days 2 through 5.  Zofran 4 mg tablets as needed every 8 hours as needed for nausea.  Rest and drink fluids.  Call if symptoms worsen.

## 2021-03-15 NOTE — Progress Notes (Signed)
   Subjective:    Patient ID: DI JASMER, female    DOB: April 08, 1956, 65 y.o.   MRN: [id number]  HPI 65 year old Female seen today via interactive audio and video telecommunications due to the Coronavirus pandemic.  Patient is agreeable to visit in this format today.  She is at her home and I am at my office.  Patient reports symptoms began on Tuesday, July 5 with sore throat and a headache.  Yesterday felt congested but last night was a rough night with head congestion and headache.  She had to sleep sitting up. Home Covid test yesterday was positive. Main issues was a rough night last night with cough malaise and congestion. She is worried about another bad night tonight. She has had 4 Covid-19 vaccines.    Review of Systems see above     Objective:   Physical Exam She is seen virtually.  Does not appear to be tachypneic at this point in time.  Not heard to be coughing but looks fatigued and a bit pale.  Today has been better than last night.  Temp currently under 100 degrees.       Assessment & Plan:  Acute COVID-19 virus infection  Have prescribed Zithromax Z-PAK 2 tablets day 1 followed by 1 tablet days 2 through 5 and Zofran 4 mg tablets as needed every 8 hours for nausea.  Rest and drink fluids.  She will call if symptoms worsen.

## 2021-04-16 ENCOUNTER — Other Ambulatory Visit (HOSPITAL_COMMUNITY): Payer: Self-pay

## 2021-04-18 ENCOUNTER — Other Ambulatory Visit (HOSPITAL_COMMUNITY): Payer: Self-pay

## 2021-04-18 ENCOUNTER — Other Ambulatory Visit: Payer: Self-pay

## 2021-04-18 MED ORDER — ROSUVASTATIN CALCIUM 10 MG PO TABS
10.0000 mg | ORAL_TABLET | Freq: Every day | ORAL | 3 refills | Status: DC
  Filled 2021-04-18: qty 90, 90d supply, fill #0

## 2021-04-30 DIAGNOSIS — H5213 Myopia, bilateral: Secondary | ICD-10-CM | POA: Diagnosis not present

## 2021-05-02 DIAGNOSIS — N898 Other specified noninflammatory disorders of vagina: Secondary | ICD-10-CM | POA: Diagnosis not present

## 2021-05-02 DIAGNOSIS — B373 Candidiasis of vulva and vagina: Secondary | ICD-10-CM | POA: Diagnosis not present

## 2021-05-21 DIAGNOSIS — Z6834 Body mass index (BMI) 34.0-34.9, adult: Secondary | ICD-10-CM | POA: Diagnosis not present

## 2021-05-21 DIAGNOSIS — Z01411 Encounter for gynecological examination (general) (routine) with abnormal findings: Secondary | ICD-10-CM | POA: Diagnosis not present

## 2021-05-21 DIAGNOSIS — Z01419 Encounter for gynecological examination (general) (routine) without abnormal findings: Secondary | ICD-10-CM | POA: Diagnosis not present

## 2021-05-21 DIAGNOSIS — M8580 Other specified disorders of bone density and structure, unspecified site: Secondary | ICD-10-CM | POA: Diagnosis not present

## 2021-05-21 DIAGNOSIS — Z124 Encounter for screening for malignant neoplasm of cervix: Secondary | ICD-10-CM | POA: Diagnosis not present

## 2021-07-01 DIAGNOSIS — H90A32 Mixed conductive and sensorineural hearing loss, unilateral, left ear with restricted hearing on the contralateral side: Secondary | ICD-10-CM | POA: Diagnosis not present

## 2021-07-01 DIAGNOSIS — H906 Mixed conductive and sensorineural hearing loss, bilateral: Secondary | ICD-10-CM | POA: Diagnosis not present

## 2021-07-09 ENCOUNTER — Other Ambulatory Visit: Payer: 59 | Admitting: Internal Medicine

## 2021-07-09 ENCOUNTER — Other Ambulatory Visit: Payer: Self-pay

## 2021-07-09 DIAGNOSIS — E782 Mixed hyperlipidemia: Secondary | ICD-10-CM

## 2021-07-09 DIAGNOSIS — Z0000 Encounter for general adult medical examination without abnormal findings: Secondary | ICD-10-CM

## 2021-07-11 ENCOUNTER — Other Ambulatory Visit (HOSPITAL_COMMUNITY): Payer: Self-pay

## 2021-07-11 ENCOUNTER — Encounter: Payer: Self-pay | Admitting: Internal Medicine

## 2021-07-11 ENCOUNTER — Ambulatory Visit (INDEPENDENT_AMBULATORY_CARE_PROVIDER_SITE_OTHER): Payer: 59 | Admitting: Internal Medicine

## 2021-07-11 ENCOUNTER — Other Ambulatory Visit: Payer: Self-pay

## 2021-07-11 VITALS — BP 132/80 | HR 74 | Temp 97.7°F | Ht 65.25 in | Wt 210.0 lb

## 2021-07-11 DIAGNOSIS — H9193 Unspecified hearing loss, bilateral: Secondary | ICD-10-CM | POA: Diagnosis not present

## 2021-07-11 DIAGNOSIS — E782 Mixed hyperlipidemia: Secondary | ICD-10-CM | POA: Diagnosis not present

## 2021-07-11 DIAGNOSIS — Z0000 Encounter for general adult medical examination without abnormal findings: Secondary | ICD-10-CM | POA: Diagnosis not present

## 2021-07-11 DIAGNOSIS — Z8669 Personal history of other diseases of the nervous system and sense organs: Secondary | ICD-10-CM

## 2021-07-11 DIAGNOSIS — Z8739 Personal history of other diseases of the musculoskeletal system and connective tissue: Secondary | ICD-10-CM | POA: Diagnosis not present

## 2021-07-11 DIAGNOSIS — R7989 Other specified abnormal findings of blood chemistry: Secondary | ICD-10-CM

## 2021-07-11 DIAGNOSIS — E559 Vitamin D deficiency, unspecified: Secondary | ICD-10-CM | POA: Diagnosis not present

## 2021-07-11 LAB — POCT URINALYSIS DIPSTICK
Bilirubin, UA: NEGATIVE
Blood, UA: NEGATIVE
Glucose, UA: NEGATIVE
Ketones, UA: NEGATIVE
Leukocytes, UA: NEGATIVE
Nitrite, UA: NEGATIVE
Protein, UA: NEGATIVE
Spec Grav, UA: 1.015 (ref 1.010–1.025)
Urobilinogen, UA: 0.2 E.U./dL
pH, UA: 5 (ref 5.0–8.0)

## 2021-07-11 MED ORDER — ERGOCALCIFEROL 1.25 MG (50000 UT) PO CAPS
50000.0000 [IU] | ORAL_CAPSULE | ORAL | 0 refills | Status: DC
  Filled 2021-07-11: qty 12, 84d supply, fill #0

## 2021-07-11 MED ORDER — ROSUVASTATIN CALCIUM 20 MG PO TABS
20.0000 mg | ORAL_TABLET | Freq: Every day | ORAL | 3 refills | Status: DC
  Filled 2021-07-11: qty 90, 90d supply, fill #0

## 2021-07-11 MED ORDER — ROSUVASTATIN CALCIUM 20 MG PO TABS
20.0000 mg | ORAL_TABLET | Freq: Every day | ORAL | 3 refills | Status: DC
  Filled 2021-07-11: qty 90, 90d supply, fill #0
  Filled 2022-01-21: qty 90, 90d supply, fill #1
  Filled 2022-03-13 – 2022-05-05 (×2): qty 90, 90d supply, fill #2

## 2021-07-11 NOTE — Progress Notes (Signed)
   Subjective:    Patient ID: [name], female    DOB: 24-Feb-1956, 65 y.o.   MRN: [id number]  HPI  65 year old Female seen today for health maintenance exam and evaluation of medical issues.  Continues to work full-time from home.  She has a history of vitamin D deficiency, hyperlipidemia, musculoskeletal pain for which she has taken Flexeril from time to time.  History of occasional migraine headaches treated with Zofran.  History of right shoulder impingement seen by Dr. Onnie Graham.  Had right shoulder arthroscopy December 2019 and did well.  History of colonoscopy with Dr. Olevia Perches in 2014 with 10-year follow-up recommended.  She is allergic to bee stings.  Has been allergy tested in the past showing minimal positivity for mold.  Spirometry showed significant postbronchodilator improvement.  She had a false positive HIV test done at the [facility] in October 2000 and unfortunately is no longer able to donate blood.  She began taking statin medication in 2015.  Her triglycerides are elevated to 29 and were 150 in May 2022.  LDL cholesterol is 103, total cholesterol 189 and HDL cholesterol 51.  Has been on Crestor 10 mg daily and I would like to increase that to 20 mg daily due to significant hypertriglyceridemia.  History of left lower lobe pneumonia March 2006.  History of cervical disc disease with bulge at C7-T1.  Had anterior cervical discectomy and decompression with fusion for right C8 radiculopathy done by Dr. Vertell Limber in 2013.  History of mild osteopenia with bone density study May 2022 showing lowest T score right femoral neck -1.3.  This is consistent with very mild osteopenia.  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  She is married.  Does not smoke or consume alcohol.  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  Family history: Father's family history is not known.  Mother with history of  hypertension, hyperlipidemia and cardiac arrhythmia.  3 brothers.  No sisters.  She has 2 children, son and a daughter both adults in good health.    Review of Systems Hearing test to be repeated by Dr. Wilburn Cornelia in the near future. Has longstanding hx of hearing loss left ear.     Objective:   Physical Exam Blood pressure 132/80 pulse 74 temperature 97.7 degrees pulse oximetry 98% weight 210 pounds BMI 34.68  Skin: Warm and dry.  Nodes none.  TMs clear.  Chest clear.  Cardiac exam: Regular rate and rhythm without ectopy.  Breasts are without masses.  Abdomen soft nondistended without hepatosplenomegaly masses or tenderness.  No lower extremity pitting edema.  Neurological exam is intact without focal deficits.       Assessment & Plan:  Mixed hyperlipidemia-Crestor increased to 20 mg daily with follow-up in February  TSH is elevated at 4.58 and will be repeated in February.  Previously it was 3.15 a year ago.  She may be developing mild hypothyroidism.  Mild osteopenia-stable  History of migraine headaches  History of musculoskeletal pain treated with Flexeril  Vitamin D deficiency-see below.  Level is 25 and will be placed on high-dose vitamin D weekly with repeat vitamin D level in February  Longstanding history of hearing loss in the left ear  Follow-up in February with repeat labs including lipid panel liver functions on increased dose of Crestor, will have vitamin D level done at that time.

## 2021-07-11 NOTE — Patient Instructions (Addendum)
Regarding hyperlipidemia, Crestor increased to 20 mg daily and follow-up in February We will follow-up on mildly elevated TSH in February with repeat lab TSH High-dose vitamin D prescribed weekly for mild vitamin D deficiency Seeing ENT physician for hearing issues.  It was a pleasure to see you today.  We will see you again in February.

## 2021-07-12 ENCOUNTER — Other Ambulatory Visit: Payer: Self-pay

## 2021-07-12 DIAGNOSIS — Z0000 Encounter for general adult medical examination without abnormal findings: Secondary | ICD-10-CM

## 2021-07-13 LAB — LIPID PANEL
Cholesterol: 189 mg/dL (ref <200)
HDL: 51 mg/dL (ref >=50)
LDL Cholesterol (Calc): 103 mg/dL (calc) — ABNORMAL HIGH
Non-HDL Cholesterol (Calc): 138 mg/dL (calc) — ABNORMAL HIGH (ref <130)
Total CHOL/HDL Ratio: 3.7 (calc) (ref <5.0)
Triglycerides: 229 mg/dL — ABNORMAL HIGH (ref <150)

## 2021-07-13 LAB — COMPLETE METABOLIC PANEL WITH GFR
AG Ratio: 1.6 (calc) (ref 1.0–2.5)
ALT: 15 U/L (ref 6–29)
AST: 18 U/L (ref 10–35)
Albumin: 4.4 g/dL (ref 3.6–5.1)
Alkaline phosphatase (APISO): 71 U/L (ref 37–153)
BUN: 17 mg/dL (ref 7–25)
CO2: 19 mmol/L — ABNORMAL LOW (ref 20–32)
Calcium: 9.7 mg/dL (ref 8.6–10.4)
Chloride: 103 mmol/L (ref 98–110)
Creat: 0.88 mg/dL (ref 0.50–1.05)
Globulin: 2.8 g/dL (calc) (ref 1.9–3.7)
Glucose, Bld: 92 mg/dL (ref 65–99)
Potassium: 4.4 mmol/L (ref 3.5–5.3)
Sodium: 143 mmol/L (ref 135–146)
Total Bilirubin: 0.3 mg/dL (ref 0.2–1.2)
Total Protein: 7.2 g/dL (ref 6.1–8.1)
eGFR: 72 mL/min/{1.73_m2} (ref >=60)

## 2021-07-13 LAB — CBC WITH DIFFERENTIAL/PLATELET
Absolute Monocytes: 405 cells/uL (ref 200–950)
Basophils Absolute: 60 cells/uL (ref 0–200)
Basophils Relative: 0.8 %
Eosinophils Absolute: 83 cells/uL (ref 15–500)
Eosinophils Relative: 1.1 %
HCT: 40.6 % (ref 35.0–45.0)
Hemoglobin: 12.9 g/dL (ref 11.7–15.5)
Lymphs Abs: 2618 cells/uL (ref 850–3900)
MCH: 28 pg (ref 27.0–33.0)
MCHC: 31.8 g/dL — ABNORMAL LOW (ref 32.0–36.0)
MCV: 88.1 fL (ref 80.0–100.0)
MPV: 9.9 fL (ref 7.5–12.5)
Monocytes Relative: 5.4 %
Neutro Abs: 4335 cells/uL (ref 1500–7800)
Neutrophils Relative %: 57.8 %
Platelets: 329 10*3/uL (ref 140–400)
RBC: 4.61 10*6/uL (ref 3.80–5.10)
RDW: 13.4 % (ref 11.0–15.0)
Total Lymphocyte: 34.9 %
WBC: 7.5 10*3/uL (ref 3.8–10.8)

## 2021-07-13 LAB — TSH: TSH: 4.58 mIU/L — ABNORMAL HIGH (ref 0.40–4.50)

## 2021-07-13 LAB — VITAMIN D 25 HYDROXY (VIT D DEFICIENCY, FRACTURES): Vit D, 25-Hydroxy: 25 ng/mL — ABNORMAL LOW (ref 30–100)

## 2021-07-16 ENCOUNTER — Other Ambulatory Visit (HOSPITAL_COMMUNITY): Payer: Self-pay

## 2021-07-20 ENCOUNTER — Other Ambulatory Visit (HOSPITAL_COMMUNITY): Payer: Self-pay

## 2021-08-22 ENCOUNTER — Telehealth: Payer: Self-pay | Admitting: Internal Medicine

## 2021-08-22 NOTE — Telephone Encounter (Signed)
[name] [phone]  Kileigh called to say she thinks she might be allergic to the Vit D she started on around 07/16/2021, she has broken out in a rash on her legs , stomach and back. Once she breaks out she takes Benadryl and Advil and it goes away. However she has also been cleaning a very nasty house that has had a smoker in it too over the last month. The rash does not stay long and is not painful. She does not have it now, she is also taking magnesium because she read on lin it is good to take it when taking Vit D. I let her know you may want to see her when she actually has the rash.

## 2021-08-22 NOTE — Telephone Encounter (Signed)
Called patient to let her know what Dr Renold Genta said and she verbalized understanding

## 2021-10-10 ENCOUNTER — Other Ambulatory Visit: Payer: 59 | Admitting: Internal Medicine

## 2021-10-10 ENCOUNTER — Other Ambulatory Visit: Payer: Self-pay

## 2021-10-10 DIAGNOSIS — R7989 Other specified abnormal findings of blood chemistry: Secondary | ICD-10-CM

## 2021-10-10 DIAGNOSIS — E782 Mixed hyperlipidemia: Secondary | ICD-10-CM

## 2021-10-10 DIAGNOSIS — E559 Vitamin D deficiency, unspecified: Secondary | ICD-10-CM

## 2021-10-11 LAB — LIPID PANEL
Cholesterol: 175 mg/dL (ref <200)
HDL: 50 mg/dL (ref >=50)
LDL Cholesterol (Calc): 94 mg/dL (calc)
Non-HDL Cholesterol (Calc): 125 mg/dL (calc) (ref <130)
Total CHOL/HDL Ratio: 3.5 (calc) (ref <5.0)
Triglycerides: 221 mg/dL — ABNORMAL HIGH (ref <150)

## 2021-10-11 LAB — HEPATIC FUNCTION PANEL
AG Ratio: 1.6 (calc) (ref 1.0–2.5)
ALT: 20 U/L (ref 6–29)
AST: 20 U/L (ref 10–35)
Albumin: 4.4 g/dL (ref 3.6–5.1)
Alkaline phosphatase (APISO): 71 U/L (ref 37–153)
Bilirubin, Direct: 0.1 mg/dL (ref 0.0–0.2)
Globulin: 2.7 g/dL (calc) (ref 1.9–3.7)
Indirect Bilirubin: 0.5 mg/dL (calc) (ref 0.2–1.2)
Total Bilirubin: 0.6 mg/dL (ref 0.2–1.2)
Total Protein: 7.1 g/dL (ref 6.1–8.1)

## 2021-10-11 LAB — VITAMIN D 25 HYDROXY (VIT D DEFICIENCY, FRACTURES): Vit D, 25-Hydroxy: 47 ng/mL (ref 30–100)

## 2021-10-11 LAB — TSH: TSH: 4.12 mIU/L (ref 0.40–4.50)

## 2021-10-15 ENCOUNTER — Encounter: Payer: Self-pay | Admitting: Internal Medicine

## 2021-10-15 ENCOUNTER — Ambulatory Visit: Payer: 59 | Admitting: Internal Medicine

## 2021-10-15 ENCOUNTER — Other Ambulatory Visit (HOSPITAL_COMMUNITY): Payer: Self-pay

## 2021-10-15 ENCOUNTER — Other Ambulatory Visit: Payer: Self-pay

## 2021-10-15 VITALS — BP 122/64 | HR 86 | Temp 97.4°F | Ht 65.25 in | Wt 216.0 lb

## 2021-10-15 DIAGNOSIS — E559 Vitamin D deficiency, unspecified: Secondary | ICD-10-CM | POA: Diagnosis not present

## 2021-10-15 DIAGNOSIS — E782 Mixed hyperlipidemia: Secondary | ICD-10-CM | POA: Diagnosis not present

## 2021-10-15 DIAGNOSIS — Z6835 Body mass index (BMI) 35.0-35.9, adult: Secondary | ICD-10-CM | POA: Diagnosis not present

## 2021-10-15 DIAGNOSIS — M8580 Other specified disorders of bone density and structure, unspecified site: Secondary | ICD-10-CM

## 2021-10-15 MED ORDER — ERGOCALCIFEROL 1.25 MG (50000 UT) PO CAPS
50000.0000 [IU] | ORAL_CAPSULE | ORAL | 3 refills | Status: DC
  Filled 2021-10-15: qty 12, 84d supply, fill #0
  Filled 2022-02-06: qty 12, 84d supply, fill #1
  Filled 2022-03-13 – 2022-05-05 (×2): qty 12, 84d supply, fill #2
  Filled 2022-07-24 – 2022-08-01 (×2): qty 12, 84d supply, fill #3

## 2021-10-15 MED ORDER — FENOFIBRATE 145 MG PO TABS
145.0000 mg | ORAL_TABLET | Freq: Every day | ORAL | 1 refills | Status: DC
  Filled 2021-10-15: qty 90, 90d supply, fill #0
  Filled 2022-02-06: qty 90, 90d supply, fill #1

## 2021-10-15 NOTE — Progress Notes (Incomplete)
° °  Subjective:    Patient ID: [name], female    DOB: 1955/10/05, 66 y.o.   MRN: [id number]  HPI    Review of Systems     Objective:   Physical Exam        Assessment & Plan:

## 2021-10-15 NOTE — Progress Notes (Incomplete)
° °  Subjective:    Patient ID: [name], female    DOB: March 12, 1956, 66 y.o.   MRN: [id number]  HPI    Review of Systems problem with left foor     Objective:   Physical Exam        Assessment & Plan:

## 2021-11-23 DIAGNOSIS — Z6835 Body mass index (BMI) 35.0-35.9, adult: Secondary | ICD-10-CM | POA: Insufficient documentation

## 2021-11-29 ENCOUNTER — Other Ambulatory Visit (HOSPITAL_COMMUNITY): Payer: Self-pay

## 2021-11-29 MED ORDER — AMOXICILLIN 500 MG PO CAPS
ORAL_CAPSULE | ORAL | 0 refills | Status: DC
  Filled 2021-11-29: qty 21, 7d supply, fill #0

## 2022-01-21 ENCOUNTER — Other Ambulatory Visit (HOSPITAL_COMMUNITY): Payer: Self-pay

## 2022-02-06 ENCOUNTER — Other Ambulatory Visit (HOSPITAL_COMMUNITY): Payer: Self-pay

## 2022-03-08 ENCOUNTER — Other Ambulatory Visit: Payer: Self-pay | Admitting: Internal Medicine

## 2022-03-09 MED ORDER — FENOFIBRATE 145 MG PO TABS
145.0000 mg | ORAL_TABLET | Freq: Every day | ORAL | 1 refills | Status: DC
  Filled 2022-03-09 – 2022-05-05 (×3): qty 90, 90d supply, fill #0
  Filled 2022-07-24 – 2022-08-01 (×2): qty 90, 90d supply, fill #1

## 2022-03-10 ENCOUNTER — Other Ambulatory Visit (HOSPITAL_COMMUNITY): Payer: Self-pay

## 2022-03-13 ENCOUNTER — Other Ambulatory Visit (HOSPITAL_COMMUNITY): Payer: Self-pay

## 2022-03-14 ENCOUNTER — Other Ambulatory Visit (HOSPITAL_COMMUNITY): Payer: Self-pay

## 2022-03-18 ENCOUNTER — Other Ambulatory Visit: Payer: 59

## 2022-03-18 DIAGNOSIS — Z79899 Other long term (current) drug therapy: Secondary | ICD-10-CM

## 2022-03-18 DIAGNOSIS — E782 Mixed hyperlipidemia: Secondary | ICD-10-CM

## 2022-03-21 ENCOUNTER — Encounter: Payer: Self-pay | Admitting: Internal Medicine

## 2022-03-21 ENCOUNTER — Ambulatory Visit (INDEPENDENT_AMBULATORY_CARE_PROVIDER_SITE_OTHER): Payer: 59 | Admitting: Internal Medicine

## 2022-03-21 VITALS — BP 130/72 | HR 73 | Temp 97.8°F | Wt 210.5 lb

## 2022-03-21 DIAGNOSIS — E782 Mixed hyperlipidemia: Secondary | ICD-10-CM

## 2022-03-21 DIAGNOSIS — Z8669 Personal history of other diseases of the nervous system and sense organs: Secondary | ICD-10-CM

## 2022-03-21 DIAGNOSIS — E559 Vitamin D deficiency, unspecified: Secondary | ICD-10-CM

## 2022-03-21 NOTE — Progress Notes (Signed)
   Subjective:    Patient ID: [name], female    DOB: [birth year], 66 y.o.   MRN: [id number]  HPI 66 year old Female seen for follow-up of mixed hyperlipidemia.  Lipid panel results not available.  Has very mild elevation of ALT (liver function).  Liver functions were normal in February.  She currently is on generic Crestor 20 mg daily.  She also takes Tricor 145 mg daily for hypertriglyceridemia.  She feels well and has no new complaints today.  She continues to work from home for [employer].  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  She has a history of vitamin D deficiency and takes high-dose vitamin D weekly.  She has a history of migraine headaches but these seem to be less frequent.  Coronary calcium scoring has not been obtained.  She will consider it.  Review of Systems see above no new complaints     Objective:   Physical Exam  Neck is supple.  No carotid bruits.  Chest clear.  Cardiac exam: Regular rate and rhythm.  No lower extremity pitting edema.      Assessment & Plan:  Mixed hyperlipidemia-she will stay on current dose of rosuvastatin 20 mg daily and Tricor 145 mg daily for hypertriglyceridemia.  Lipid panel is not back yet.  This will be investigated.  Liver functions are stable except for an ALT of 34 normal being up to 29.  History of vitamin D deficiency-we will continue with high-dose vitamin D weekly 50,000 units  Plan:

## 2022-03-21 NOTE — Patient Instructions (Signed)
We will check with lab about results of lipid panel.  This should be back.  Liver functions are stable except for an ALT of 34 slightly up from normal of 29.  She is tolerating her medications well without side effects.  Continue with high-dose vitamin D weekly.

## 2022-03-21 NOTE — Addendum Note (Signed)
Addended by: Angus Seller on: 03/21/2022 04:14 PM   Modules accepted: Orders

## 2022-03-22 LAB — HEPATIC FUNCTION PANEL
AG Ratio: 2 (calc) (ref 1.0–2.5)
ALT: 34 U/L — ABNORMAL HIGH (ref 6–29)
AST: 32 U/L (ref 10–35)
Albumin: 4.6 g/dL (ref 3.6–5.1)
Alkaline phosphatase (APISO): 61 U/L (ref 37–153)
Bilirubin, Direct: 0.2 mg/dL (ref 0.0–0.2)
Globulin: 2.3 g/dL (calc) (ref 1.9–3.7)
Indirect Bilirubin: 0.5 mg/dL (calc) (ref 0.2–1.2)
Total Bilirubin: 0.7 mg/dL (ref 0.2–1.2)
Total Protein: 6.9 g/dL (ref 6.1–8.1)

## 2022-03-22 LAB — LIPID PANEL
Cholesterol: 134 mg/dL (ref <200)
HDL: 57 mg/dL (ref >=50)
LDL Cholesterol (Calc): 58 mg/dL (calc)
Non-HDL Cholesterol (Calc): 77 mg/dL (calc) (ref <130)
Total CHOL/HDL Ratio: 2.4 (calc) (ref <5.0)
Triglycerides: 109 mg/dL (ref <150)

## 2022-04-30 DIAGNOSIS — H5213 Myopia, bilateral: Secondary | ICD-10-CM | POA: Diagnosis not present

## 2022-05-05 ENCOUNTER — Other Ambulatory Visit (HOSPITAL_COMMUNITY): Payer: Self-pay

## 2022-05-26 DIAGNOSIS — Z6834 Body mass index (BMI) 34.0-34.9, adult: Secondary | ICD-10-CM | POA: Diagnosis not present

## 2022-05-26 DIAGNOSIS — Z01411 Encounter for gynecological examination (general) (routine) with abnormal findings: Secondary | ICD-10-CM | POA: Diagnosis not present

## 2022-05-26 DIAGNOSIS — Z124 Encounter for screening for malignant neoplasm of cervix: Secondary | ICD-10-CM | POA: Diagnosis not present

## 2022-05-26 DIAGNOSIS — Z01419 Encounter for gynecological examination (general) (routine) without abnormal findings: Secondary | ICD-10-CM | POA: Diagnosis not present

## 2022-05-26 DIAGNOSIS — Z0142 Encounter for cervical smear to confirm findings of recent normal smear following initial abnormal smear: Secondary | ICD-10-CM | POA: Diagnosis not present

## 2022-05-27 DIAGNOSIS — H9193 Unspecified hearing loss, bilateral: Secondary | ICD-10-CM | POA: Diagnosis not present

## 2022-05-27 DIAGNOSIS — H90A12 Conductive hearing loss, unilateral, left ear with restricted hearing on the contralateral side: Secondary | ICD-10-CM | POA: Diagnosis not present

## 2022-05-27 DIAGNOSIS — H90A21 Sensorineural hearing loss, unilateral, right ear, with restricted hearing on the contralateral side: Secondary | ICD-10-CM | POA: Diagnosis not present

## 2022-05-30 DIAGNOSIS — H906 Mixed conductive and sensorineural hearing loss, bilateral: Secondary | ICD-10-CM | POA: Diagnosis not present

## 2022-07-08 ENCOUNTER — Other Ambulatory Visit: Payer: 59

## 2022-07-08 DIAGNOSIS — R7989 Other specified abnormal findings of blood chemistry: Secondary | ICD-10-CM

## 2022-07-08 DIAGNOSIS — Z136 Encounter for screening for cardiovascular disorders: Secondary | ICD-10-CM

## 2022-07-08 DIAGNOSIS — E782 Mixed hyperlipidemia: Secondary | ICD-10-CM

## 2022-07-11 ENCOUNTER — Other Ambulatory Visit: Payer: 59

## 2022-07-11 ENCOUNTER — Other Ambulatory Visit (HOSPITAL_COMMUNITY): Payer: Self-pay

## 2022-07-11 DIAGNOSIS — E781 Pure hyperglyceridemia: Secondary | ICD-10-CM

## 2022-07-11 DIAGNOSIS — R7989 Other specified abnormal findings of blood chemistry: Secondary | ICD-10-CM | POA: Diagnosis not present

## 2022-07-11 DIAGNOSIS — E782 Mixed hyperlipidemia: Secondary | ICD-10-CM | POA: Diagnosis not present

## 2022-07-12 LAB — COMPLETE METABOLIC PANEL WITH GFR
AG Ratio: 1.7 (calc) (ref 1.0–2.5)
ALT: 20 U/L (ref 6–29)
AST: 22 U/L (ref 10–35)
Albumin: 4.4 g/dL (ref 3.6–5.1)
Alkaline phosphatase (APISO): 52 U/L (ref 37–153)
BUN: 16 mg/dL (ref 7–25)
CO2: 28 mmol/L (ref 20–32)
Calcium: 9.8 mg/dL (ref 8.6–10.4)
Chloride: 105 mmol/L (ref 98–110)
Creat: 1.03 mg/dL (ref 0.50–1.05)
Globulin: 2.6 g/dL (calc) (ref 1.9–3.7)
Glucose, Bld: 96 mg/dL (ref 65–99)
Potassium: 4.4 mmol/L (ref 3.5–5.3)
Sodium: 141 mmol/L (ref 135–146)
Total Bilirubin: 0.4 mg/dL (ref 0.2–1.2)
Total Protein: 7 g/dL (ref 6.1–8.1)
eGFR: 60 mL/min/{1.73_m2} (ref >=60)

## 2022-07-12 LAB — CBC WITH DIFFERENTIAL/PLATELET
Absolute Monocytes: 490 cells/uL (ref 200–950)
Basophils Absolute: 68 cells/uL (ref 0–200)
Basophils Relative: 1 %
Eosinophils Absolute: 109 cells/uL (ref 15–500)
Eosinophils Relative: 1.6 %
HCT: 39.3 % (ref 35.0–45.0)
Hemoglobin: 13 g/dL (ref 11.7–15.5)
Lymphs Abs: 2768 cells/uL (ref 850–3900)
MCH: 28.4 pg (ref 27.0–33.0)
MCHC: 33.1 g/dL (ref 32.0–36.0)
MCV: 85.8 fL (ref 80.0–100.0)
MPV: 10.3 fL (ref 7.5–12.5)
Monocytes Relative: 7.2 %
Neutro Abs: 3366 cells/uL (ref 1500–7800)
Neutrophils Relative %: 49.5 %
Platelets: 356 10*3/uL (ref 140–400)
RBC: 4.58 10*6/uL (ref 3.80–5.10)
RDW: 13.3 % (ref 11.0–15.0)
Total Lymphocyte: 40.7 %
WBC: 6.8 10*3/uL (ref 3.8–10.8)

## 2022-07-12 LAB — LIPID PANEL
Cholesterol: 150 mg/dL (ref <200)
HDL: 57 mg/dL (ref >=50)
LDL Cholesterol (Calc): 75 mg/dL (calc)
Non-HDL Cholesterol (Calc): 93 mg/dL (calc) (ref <130)
Total CHOL/HDL Ratio: 2.6 (calc) (ref <5.0)
Triglycerides: 94 mg/dL (ref <150)

## 2022-07-12 LAB — TSH: TSH: 3.57 mIU/L (ref 0.40–4.50)

## 2022-07-15 ENCOUNTER — Encounter: Payer: Self-pay | Admitting: Internal Medicine

## 2022-07-15 ENCOUNTER — Ambulatory Visit (INDEPENDENT_AMBULATORY_CARE_PROVIDER_SITE_OTHER): Payer: 59 | Admitting: Internal Medicine

## 2022-07-15 VITALS — BP 128/80 | HR 85 | Temp 98.0°F | Ht 64.5 in | Wt 212.8 lb

## 2022-07-15 DIAGNOSIS — H9012 Conductive hearing loss, unilateral, left ear, with unrestricted hearing on the contralateral side: Secondary | ICD-10-CM | POA: Diagnosis not present

## 2022-07-15 DIAGNOSIS — Z0000 Encounter for general adult medical examination without abnormal findings: Secondary | ICD-10-CM | POA: Diagnosis not present

## 2022-07-15 DIAGNOSIS — Z6835 Body mass index (BMI) 35.0-35.9, adult: Secondary | ICD-10-CM

## 2022-07-15 DIAGNOSIS — M8580 Other specified disorders of bone density and structure, unspecified site: Secondary | ICD-10-CM

## 2022-07-15 DIAGNOSIS — E782 Mixed hyperlipidemia: Secondary | ICD-10-CM

## 2022-07-15 DIAGNOSIS — R7989 Other specified abnormal findings of blood chemistry: Secondary | ICD-10-CM | POA: Diagnosis not present

## 2022-07-15 DIAGNOSIS — Z8669 Personal history of other diseases of the nervous system and sense organs: Secondary | ICD-10-CM | POA: Diagnosis not present

## 2022-07-15 DIAGNOSIS — Z8739 Personal history of other diseases of the musculoskeletal system and connective tissue: Secondary | ICD-10-CM | POA: Diagnosis not present

## 2022-07-15 DIAGNOSIS — H903 Sensorineural hearing loss, bilateral: Secondary | ICD-10-CM

## 2022-07-15 LAB — POCT URINALYSIS DIPSTICK
Bilirubin, UA: NEGATIVE
Blood, UA: NEGATIVE
Glucose, UA: NEGATIVE
Ketones, UA: NEGATIVE
Leukocytes, UA: NEGATIVE
Nitrite, UA: NEGATIVE
Protein, UA: NEGATIVE
Spec Grav, UA: 1.015 (ref 1.010–1.025)
Urobilinogen, UA: 0.2 E.U./dL
pH, UA: 5 (ref 5.0–8.0)

## 2022-07-15 NOTE — Progress Notes (Signed)
Subjective:    Patient ID: [name], female    DOB: November 25, 1955, 66 y.o.   MRN: [id number]  HPI 66 year old Female seen for health maintenance exam and evaluation of medical issues.  Continues to work full-time for [employer].  She has a history of vitamin D deficiency, hyperlipidemia, musculoskeletal pain for which she has taken Flexeril from time to time.  History of occasional migraine headaches treated with Zofran.  History of right shoulder impingement previously seen by Dr. Onnie Graham.  She had right shoulder arthroscopy December 2019 and did well.  History of colonoscopy with Dr. Maurene Capes in 2014 with 10-year follow-up recommended.  She is allergic to bee stings.  Has been allergy tested in the past showing minimal positivity for mold.  Spirometry showed significant postbronchodilator improvement.  She had a false positive HIV test done at the [facility] in October 2000, and unfortunately is no longer able to donate blood.  She began taking statin medication in 2015.  Her triglycerides have improved to normal from February 2023 at which time they were 221.  They are now normal at 94.  Crestor dose was increased from 10 mg to 20 mg in November 2022.  History of left lower lobe pneumonia March 2006.  History of cervical disc disease with bulges C7-T1.  History of anterior cervical discectomy and decompression with fusion for right C8 radiculopathy done by Dr. Vertell Limber in 2014.  History of mild osteopenia with bone density study May 2022 showing lowest T-score right femoral neck -1.3.  This is consistent with very mild osteopenia.  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.  She enjoys her work.  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  She is married.  Does not smoke or consume alcohol.  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  Family history: Father's family history is not known.  Mother with history of hypertension, hyperlipidemia and cardiac  arrhythmia.  3 brothers.  No sisters.  She has 2 children, a son and a daughter, both adults in good health.  Her TSH is 4.12 and a year ago was 4.58.  3 years ago was 3.31.  Continue to monitor.  We could place her on low-dose thyroid replacement medication aiming to get the TSH closer to 1 but she does not want to do that at the present time.  Review of Systems   Saw Dr Constance Holster in September 2023.  She has bilateral sensorineural hearing loss and conductive hearing loss on the left.  She had mastoiditis as a small child but did not have any surgery that she can recall.  He would recommend hearing aids.  He did not feel surgical intervention was necessary.     Objective:   Physical Exam Vital signs reviewed.  Blood pressure 120/80, pulse 85, temperature 98 degrees pulse oximetry 99% weight 212 pounds 12.8 ounces.  BMI 35.96.  Skin: Warm and dry.  No cervical adenopathy, thyromegaly or carotid bruits.  TMs clear.  Chest clear.  Cardiac exam: Regular rate and rhythm without murmur or ectopy.  Breasts are without masses.  Cardiac exam: Regular rate and rhythm without ectopy.  GYN exam deferred to Laurin Coder, nurse practitioner.  No lower extremity pitting edema.  Neuro is intact without gross focal deficits.  Affect thought and judgment are normal.       Assessment & Plan:  Sensorineural hearing loss-bilateral  Conductive hearing loss left ear  Bilateral sensorineural hearing loss-followed by  Dr. Constance Holster.  He is recommending hearing aids.  Mixed hyperlipidemia-continue rosuvastatin and Tricor.  Lipid panel is normal.  History of vitamin D deficiency-continue high-dose vitamin D  Elevated TSH-continue to monitor  History of osteopenia-bone density study done in 2022 and T-score -1.3  History of migraine headaches  Plan:

## 2022-07-23 ENCOUNTER — Encounter: Payer: Self-pay | Admitting: Internal Medicine

## 2022-07-24 ENCOUNTER — Other Ambulatory Visit (HOSPITAL_COMMUNITY): Payer: Self-pay

## 2022-07-24 ENCOUNTER — Other Ambulatory Visit: Payer: Self-pay | Admitting: Internal Medicine

## 2022-07-24 MED ORDER — ROSUVASTATIN CALCIUM 20 MG PO TABS
20.0000 mg | ORAL_TABLET | Freq: Every day | ORAL | 3 refills | Status: DC
  Filled 2022-07-24 – 2022-08-01 (×2): qty 90, 90d supply, fill #0
  Filled 2022-10-01 – 2022-10-23 (×2): qty 90, 90d supply, fill #1
  Filled 2023-03-24: qty 90, 90d supply, fill #2
  Filled 2023-07-24: qty 90, 90d supply, fill #3

## 2022-08-01 ENCOUNTER — Other Ambulatory Visit (HOSPITAL_COMMUNITY): Payer: Self-pay

## 2022-08-07 NOTE — Patient Instructions (Addendum)
It was a pleasure to see you today.  Continue current medications and follow-up in 6 months.  Mammogram ordered.  Referral to GI for colonoscopy.

## 2022-08-19 ENCOUNTER — Encounter: Payer: Self-pay | Admitting: Gastroenterology

## 2022-09-16 ENCOUNTER — Ambulatory Visit (AMBULATORY_SURGERY_CENTER): Payer: 59

## 2022-09-16 ENCOUNTER — Other Ambulatory Visit (HOSPITAL_COMMUNITY): Payer: Self-pay

## 2022-09-16 VITALS — Ht 65.0 in | Wt 207.0 lb

## 2022-09-16 DIAGNOSIS — Z86010 Personal history of colonic polyps: Secondary | ICD-10-CM

## 2022-09-16 IMAGING — MG MM DIGITAL SCREENING BILAT W/ TOMO AND CAD
8 series · 8 of 24 positions shown · non-contrast
Comparison: Previous exam(s).

CLINICAL DATA: Screening.

EXAM:
DIGITAL SCREENING BILATERAL MAMMOGRAM WITH TOMOSYNTHESIS AND CAD
TECHNIQUE: Bilateral screening digital craniocaudal and mediolateral oblique
mammograms were obtained. Bilateral screening digital breast
tomosynthesis was performed. The images were evaluated with
computer-aided detection.

[L MLO synth-2D]
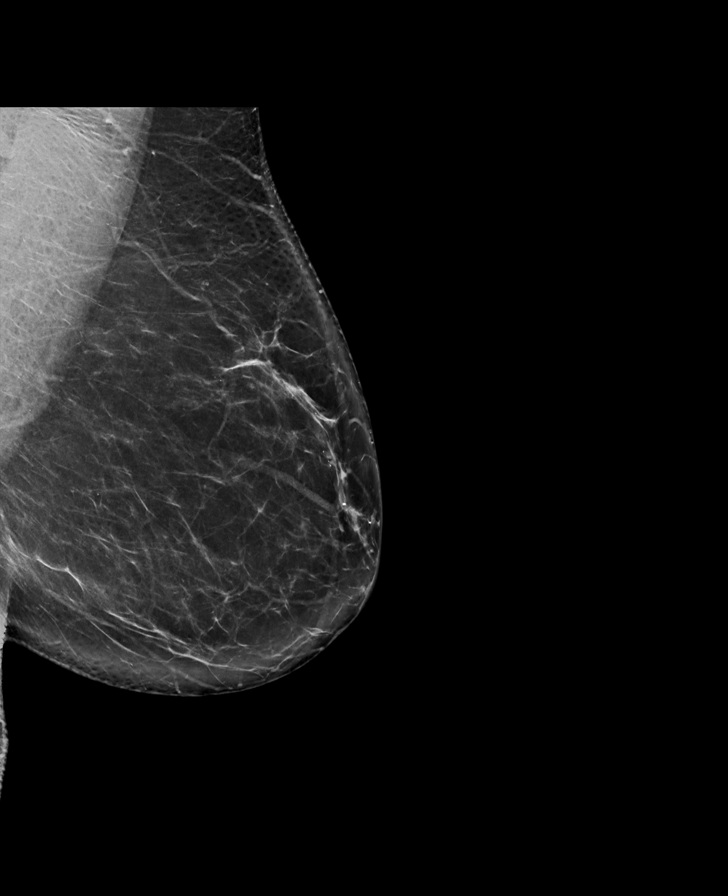

[R CC synth-2D]
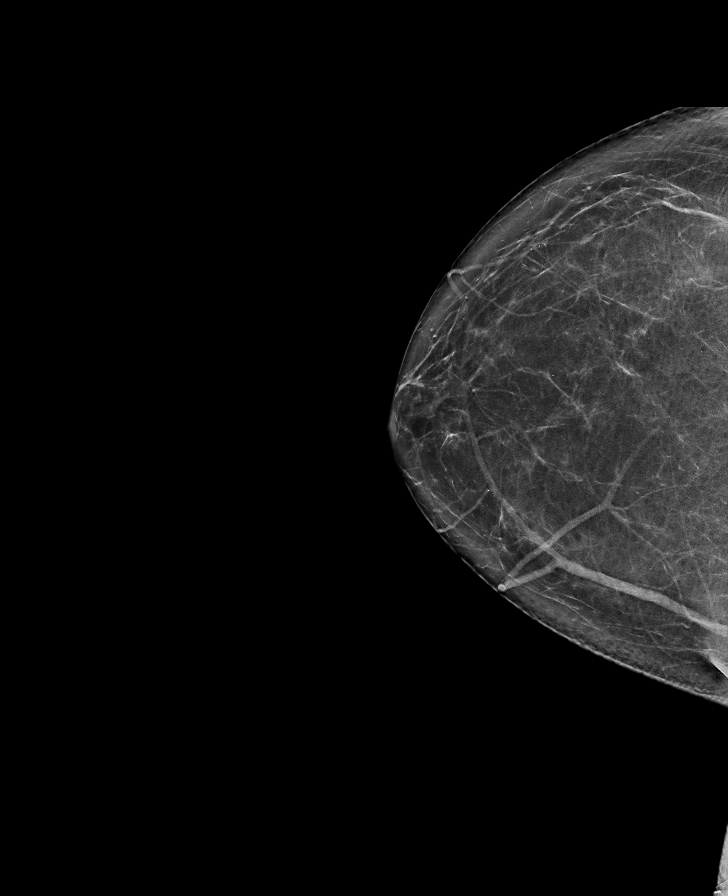

[R MLO synth-2D]
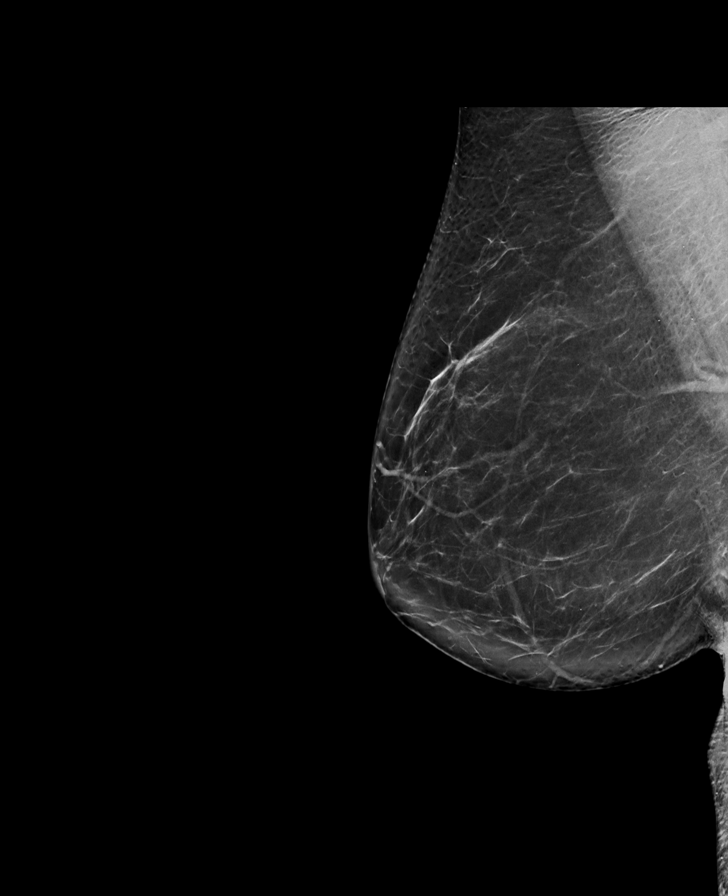

[L CC synth-2D]
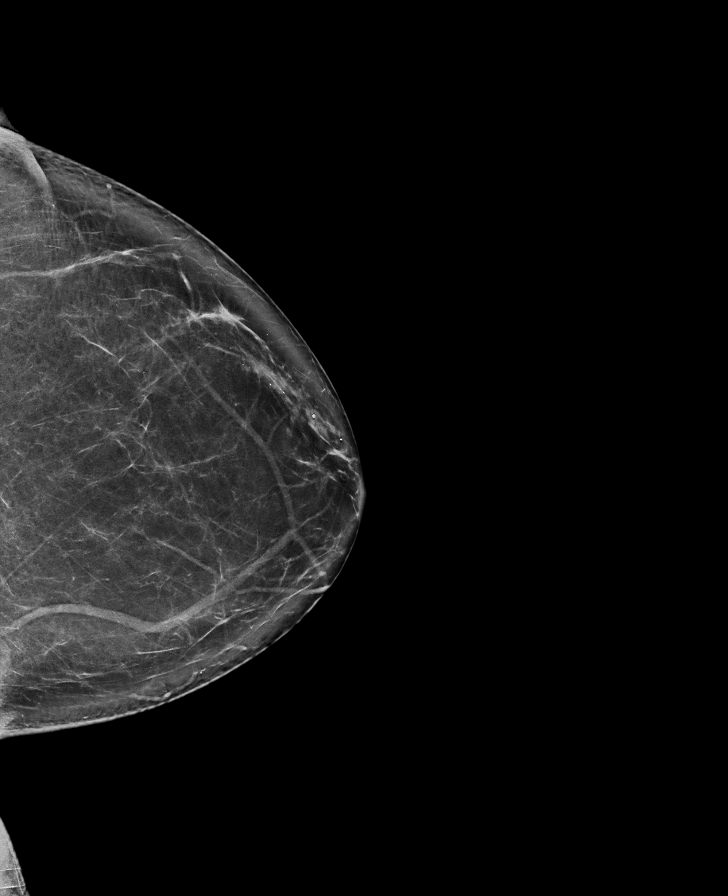

[R CC tomo · tomo slice 41/80.0]
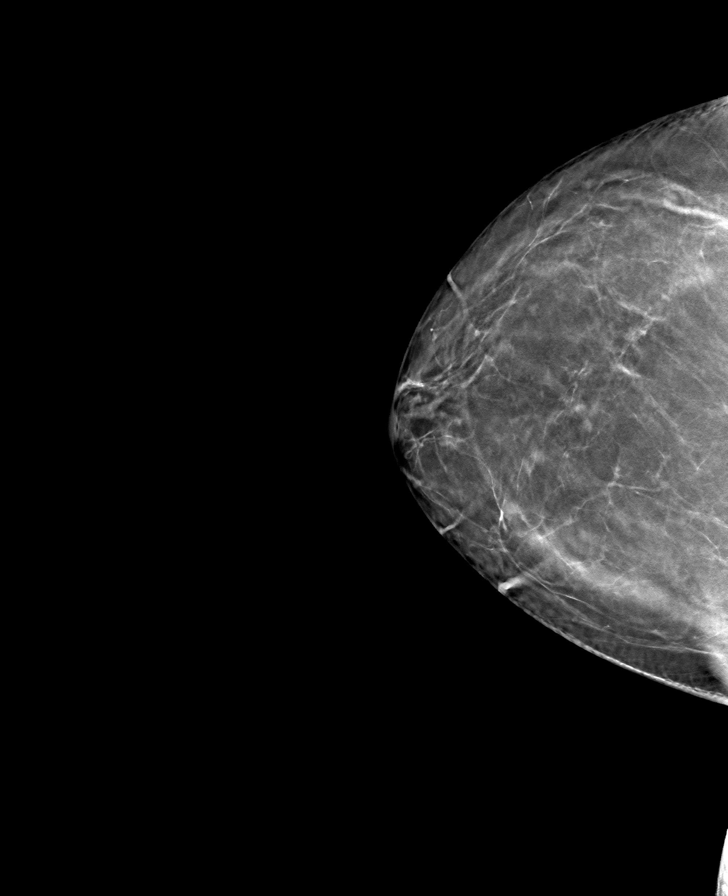

[R MLO tomo · tomo slice 46/91.0]
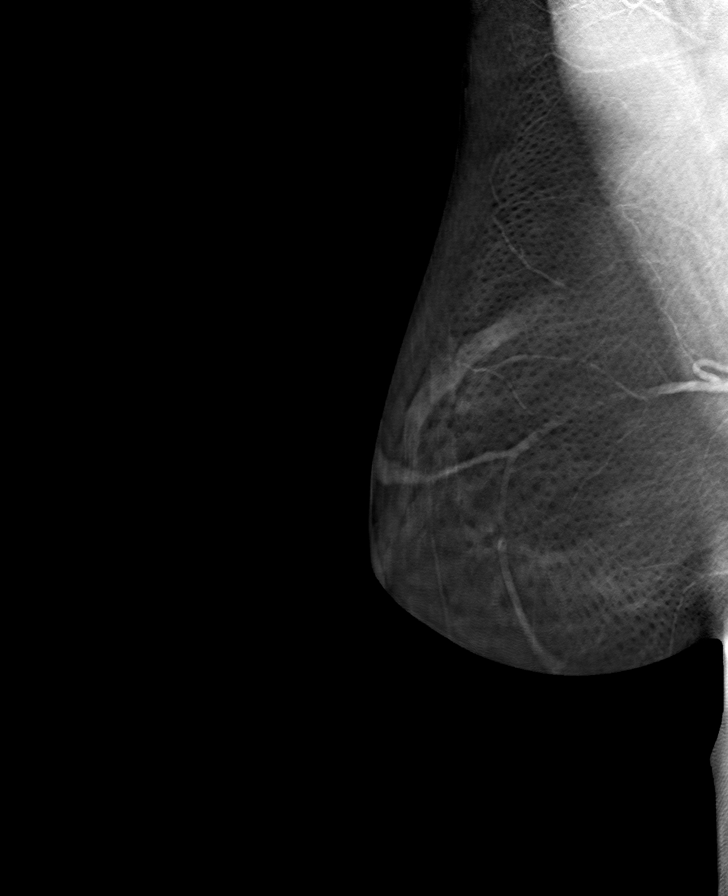

[L MLO tomo · tomo slice 45/90.0]
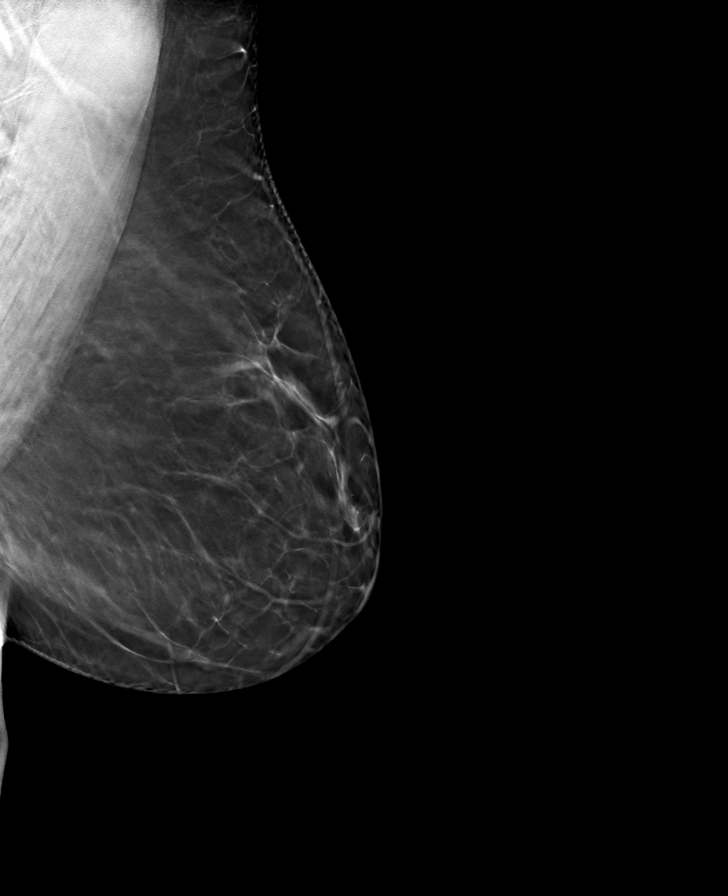

[L CC tomo · tomo slice 43/84.0]
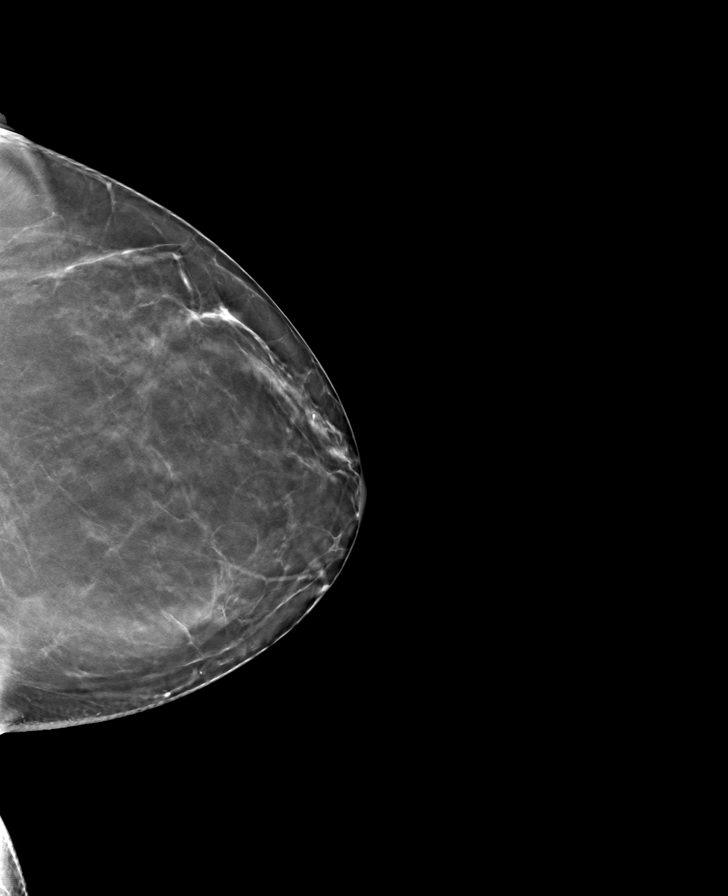

[8 of 24 positions shown; findings below may reference images not displayed]

ACR Breast Density Category b: There are scattered areas of
fibroglandular density.
FINDINGS: There are no findings suspicious for malignancy. The images were
evaluated with computer-aided detection.
IMPRESSION: No mammographic evidence of malignancy. A result letter of this
screening mammogram will be mailed directly to the patient.

RECOMMENDATION:
Screening mammogram in one year. (Code:WJ-I-BG6)

BI-RADS CATEGORY  1: Negative.

## 2022-09-16 MED ORDER — NA SULFATE-K SULFATE-MG SULF 17.5-3.13-1.6 GM/177ML PO SOLN
1.0000 | Freq: Once | ORAL | 0 refills | Status: AC
  Filled 2022-09-16: qty 354, 30d supply, fill #0
  Filled 2022-10-02: qty 354, 1d supply, fill #0

## 2022-09-16 NOTE — Progress Notes (Signed)
Pre visit completed via phone call; Patient verified name, DOB, and address;  No egg or soy allergy known to patient  No issues known to pt with past sedation with any surgeries or procedures Patient denies ever being told they had issues or difficulty with intubation  No FH of Malignant Hyperthermia Pt is not on diet pills Pt is not on home 02  Pt is not on blood thinners  Pt denies issues with constipation;  No A fib or A flutter Have any cardiac testing pending--NO Pt instructed to use Singlecare.com or GoodRx for a price reduction on prep   Insurance verified during PV appt=[facility] [employer]  Patient's chart reviewed by Osvaldo Angst CNRA prior to previsit and patient appropriate for the [facility].  Previsit completed and red dot placed by patient's name on their procedure day (on provider's schedule).

## 2022-09-17 ENCOUNTER — Ambulatory Visit
Admission: RE | Admit: 2022-09-17 | Discharge: 2022-09-17 | Disposition: A | Discharge status: Home / Self Care | Payer: 59 | Source: Ambulatory Visit | Attending: Internal Medicine | Admitting: Internal Medicine

## 2022-09-17 DIAGNOSIS — Z1231 Encounter for screening mammogram for malignant neoplasm of breast: Secondary | ICD-10-CM | POA: Diagnosis not present

## 2022-09-25 ENCOUNTER — Other Ambulatory Visit (HOSPITAL_COMMUNITY): Payer: Self-pay

## 2022-10-01 ENCOUNTER — Other Ambulatory Visit: Payer: Self-pay

## 2022-10-01 ENCOUNTER — Other Ambulatory Visit (HOSPITAL_COMMUNITY): Payer: Self-pay

## 2022-10-01 ENCOUNTER — Other Ambulatory Visit: Payer: Self-pay | Admitting: Internal Medicine

## 2022-10-01 MED ORDER — FENOFIBRATE 145 MG PO TABS
145.0000 mg | ORAL_TABLET | Freq: Every day | ORAL | 1 refills | Status: DC
  Filled 2022-10-01 – 2022-10-23 (×2): qty 90, 90d supply, fill #0
  Filled 2023-03-24: qty 90, 90d supply, fill #1

## 2022-10-01 MED ORDER — ONDANSETRON HCL 4 MG PO TABS
4.0000 mg | ORAL_TABLET | Freq: Three times a day (TID) | ORAL | 0 refills | Status: DC | PRN
  Filled 2022-10-01: qty 20, 7d supply, fill #0

## 2022-10-02 ENCOUNTER — Other Ambulatory Visit (HOSPITAL_COMMUNITY): Payer: Self-pay

## 2022-10-06 ENCOUNTER — Encounter: Payer: Self-pay | Admitting: Gastroenterology

## 2022-10-10 ENCOUNTER — Encounter: Payer: Self-pay | Admitting: Gastroenterology

## 2022-10-10 ENCOUNTER — Ambulatory Visit: Payer: 59 | Admitting: Gastroenterology

## 2022-10-10 VITALS — BP 164/86 | HR 74 | Temp 98.3°F | Resp 13 | Ht 65.0 in | Wt 207.0 lb

## 2022-10-10 DIAGNOSIS — D122 Benign neoplasm of ascending colon: Secondary | ICD-10-CM

## 2022-10-10 DIAGNOSIS — Z1211 Encounter for screening for malignant neoplasm of colon: Secondary | ICD-10-CM

## 2022-10-10 MED ORDER — SODIUM CHLORIDE 0.9 % IV SOLN: 500.0000 mL | Freq: Once | INTRAVENOUS | Status: DC

## 2022-10-10 NOTE — Progress Notes (Signed)
Pt's states no medical or surgical changes since previsit or office visit. 

## 2022-10-10 NOTE — Op Note (Signed)
[facility] Patient Name: [name] Procedure Date: 10/10/2022 9:49 AM MRN: [id number] Endoscopist: Mauri Pole , MD, [id number] Age: 67 Referring MD:  Date of Birth: 1956-07-20 Gender: Female Account #: [id number] Procedure:                Colonoscopy Indications:              Screening for colorectal malignant neoplasm Medicines:                Monitored Anesthesia Care Procedure:                Pre-Anesthesia Assessment:                           - Prior to the procedure, a History and Physical                            was performed, and patient medications and                            allergies were reviewed. The patient's tolerance of                            previous anesthesia was also reviewed. The risks                            and benefits of the procedure and the sedation                            options and risks were discussed with the patient.                            All questions were answered, and informed consent                            was obtained. Prior Anticoagulants: The patient has                            taken no anticoagulant or antiplatelet agents. ASA                            Grade Assessment: II - A patient with mild systemic                            disease. After reviewing the risks and benefits,                            the patient was deemed in satisfactory condition to                            undergo the procedure.                           After obtaining informed consent, the colonoscope  was passed under direct vision. Throughout the                            procedure, the patient's blood pressure, pulse, and                            oxygen saturations were monitored continuously. The                            Olympus PCF-H190DL (#[id number]) Colonoscope was                            introduced through the anus and advanced to the the                            cecum,  identified by appendiceal orifice and                            ileocecal valve. The colonoscopy was performed                            without difficulty. The patient tolerated the                            procedure well. The quality of the bowel                            preparation was excellent. The ileocecal valve,                            appendiceal orifice, and rectum were photographed. Scope In: 10:01:59 AM Scope Out: 10:15:14 AM Scope Withdrawal Time: 0 hours 10 minutes 29 seconds  Total Procedure Duration: 0 hours 13 minutes 15 seconds  Findings:                 The perianal and digital rectal examinations were                            normal.                           Three sessile polyps were found in the ascending                            colon. The polyps were 4 to 7 mm in size. These                            polyps were removed with a cold snare. Resection                            and retrieval were complete.                           Non-bleeding external and internal hemorrhoids were  found during retroflexion. The hemorrhoids were                            small.                           The exam was otherwise without abnormality. Complications:            No immediate complications. Estimated Blood Loss:     Estimated blood loss was minimal. Impression:               - Three 4 to 7 mm polyps in the ascending colon,                            removed with a cold snare. Resected and retrieved.                           - Non-bleeding external and internal hemorrhoids.                           - The examination was otherwise normal. Recommendation:           - Patient has a contact number available for                            emergencies. The signs and symptoms of potential                            delayed complications were discussed with the                            patient. Return to normal activities tomorrow.                             Written discharge instructions were provided to the                            patient.                           - Resume previous diet.                           - Continue present medications.                           - Await pathology results.                           - Repeat colonoscopy in 3 - 5 years for                            surveillance based on pathology results. Mauri Pole, MD 10/10/2022 10:21:13 AM This report has been signed electronically.

## 2022-10-10 NOTE — Progress Notes (Signed)
Called to room to assist during endoscopic procedure.  Patient ID and intended procedure confirmed with present staff. Received instructions for my participation in the procedure from the performing physician.  

## 2022-10-10 NOTE — Progress Notes (Signed)
To pacu, VSS. Report to [email or web address] 

## 2022-10-10 NOTE — Patient Instructions (Signed)
   Please read handouts provided about hemorrhoids and polyps.  Resume previous diet.  Continue present medications.  Await pathology results.  Repeat colonoscopy in 3-5 years for surveillance based on pathology results.    YOU HAD AN ENDOSCOPIC PROCEDURE TODAY AT [facility]:   Refer to the procedure report that was given to you for any specific questions about what was found during the examination.  If the procedure report does not answer your questions, please call your gastroenterologist to clarify.  If you requested that your care partner not be given the details of your procedure findings, then the procedure report has been included in a sealed envelope for you to review at your convenience later.  YOU SHOULD EXPECT: Some feelings of bloating in the abdomen. Passage of more gas than usual.  Walking can help get rid of the air that was put into your GI tract during the procedure and reduce the bloating. If you had a lower endoscopy (such as a colonoscopy or flexible sigmoidoscopy) you may notice spotting of blood in your stool or on the toilet paper. If you underwent a bowel prep for your procedure, you may not have a normal bowel movement for a few days.  Please Note:  You might notice some irritation and congestion in your nose or some drainage.  This is from the oxygen used during your procedure.  There is no need for concern and it should clear up in a day or so.  SYMPTOMS TO REPORT IMMEDIATELY:  Following lower endoscopy (colonoscopy or flexible sigmoidoscopy):  Excessive amounts of blood in the stool  Significant tenderness or worsening of abdominal pains  Swelling of the abdomen that is new, acute  Fever of 100F or higher  For urgent or emergent issues, a gastroenterologist can be reached at any hour by calling [phone]. Do not use MyChart messaging for urgent concerns.    DIET:  We do recommend a small meal at first, but then you may proceed to your regular  diet.  Drink plenty of fluids but you should avoid alcoholic beverages for 24 hours.  ACTIVITY:  You should plan to take it easy for the rest of today and you should NOT DRIVE or use heavy machinery until tomorrow (because of the sedation medicines used during the test).    FOLLOW UP: Our staff will call the number listed on your records the next business day following your procedure.  We will call around 7:15- 8:00 am to check on you and address any questions or concerns that you may have regarding the information given to you following your procedure. If we do not reach you, we will leave a message.     If any biopsies were taken you will be contacted by phone or by letter within the next 1-3 weeks.  Please call us at [phone] if you have not heard about the biopsies in 3 weeks.    SIGNATURES/CONFIDENTIALITY: You and/or your care partner have signed paperwork which will be entered into your electronic medical record.  These signatures attest to the fact that that the information above on your After Visit Summary has been reviewed and is understood.  Full responsibility of the confidentiality of this discharge information lies with you and/or your care-partner.

## 2022-10-10 NOTE — Progress Notes (Signed)
[facility] History and Physical   Primary Care Physician:  Elby Showers, MD   Reason for Procedure:  Colorectal cancer screening  Plan:    Screening colonoscopy with possible interventions as needed     HPI: [name] is a very pleasant 67 y.o. female here for screening colonoscopy. Denies any nausea, vomiting, abdominal pain, melena or bright red blood per rectum  The risks and benefits as well as alternatives of endoscopic procedure(s) have been discussed and reviewed. All questions answered. The patient agrees to proceed.    Past Medical History:  Diagnosis Date   Asthma    NO PROBLEMS IN4/5 YRS   Hyperlipidemia    on meds   Osteopenia     Past Surgical History:  Procedure Laterality Date   ANTERIOR CERVICAL DECOMP/DISCECTOMY FUSION  09/11/2011   Procedure: ANTERIOR CERVICAL DECOMPRESSION/DISCECTOMY FUSION 1 LEVEL;  Surgeon: Peggyann Shoals, MD;  Location: [facility];  Service: Neurosurgery;  Laterality: N/A;  Cervical seven-Thoracic one anterior cervical decompression/discectomy fusion   BILATERAL CARPAL TUNNEL RELEASE Bilateral 08/09/2015   Procedure: BILATERAL CARPAL TUNNEL RELEASE;  Surgeon: Daryll Brod, MD;  Location: [facility];  Service: Orthopedics;  Laterality: Bilateral;   COLONOSCOPY  2014   Dr. Brodie-miralax(exc)-TA   POLYPECTOMY  2014   TA   ROTATOR CUFF REPAIR Right 2019   Dr.Supple   TONSILLECTOMY  1962    Prior to Admission medications   Medication Sig Start Date End Date Taking? Authorizing Provider  ergocalciferol (DRISDOL) 1.25 MG (50000 UT) capsule Take 1 capsule  by mouth once a week. 10/15/21  Yes Baxley, Cresenciano Lick, MD  fenofibrate (TRICOR) 145 MG tablet Take 1 tablet by mouth daily. 10/01/22  Yes Baxley, Cresenciano Lick, MD  rosuvastatin (CRESTOR) 20 MG tablet Take 1 tablet (20 mg total) by mouth daily. 07/24/22  Yes Baxley, Cresenciano Lick, MD  Calcium Carbonate-Vit D-Min (CALCIUM 1200 PO) Take by mouth 2 (two) times daily.  1200/1000iu Patient not taking: Reported on 09/16/2022    [provider]  cyclobenzaprine (FLEXERIL) 10 MG tablet TAKE 1 TABLET BY MOUTH THREE TIMES A DAY AS NEEDED FOR MUSCLE SPASMS 08/18/18   Elby Showers, MD  ondansetron (ZOFRAN) 4 MG tablet Take 1 tablet (4 mg total) by mouth every 8 (eight) hours as needed for nausea or vomiting. 10/01/22   Elby Showers, MD    Current Outpatient Medications  Medication Sig Dispense Refill   ergocalciferol (DRISDOL) 1.25 MG (50000 UT) capsule Take 1 capsule  by mouth once a week. 12 capsule 3   fenofibrate (TRICOR) 145 MG tablet Take 1 tablet by mouth daily. 90 tablet 1   rosuvastatin (CRESTOR) 20 MG tablet Take 1 tablet (20 mg total) by mouth daily. 90 tablet 3   Calcium Carbonate-Vit D-Min (CALCIUM 1200 PO) Take by mouth 2 (two) times daily. 1200/1000iu (Patient not taking: Reported on 09/16/2022)     cyclobenzaprine (FLEXERIL) 10 MG tablet TAKE 1 TABLET BY MOUTH THREE TIMES A DAY AS NEEDED FOR MUSCLE SPASMS 90 tablet 5   ondansetron (ZOFRAN) 4 MG tablet Take 1 tablet (4 mg total) by mouth every 8 (eight) hours as needed for nausea or vomiting. 20 tablet 0   Current Facility-Administered Medications  Medication Dose Route Frequency Provider Last Rate Last Admin   0.9 %  sodium chloride infusion  500 mL Intravenous Once Mauri Pole, MD        Allergies as of 10/10/2022 - Review Complete 10/10/2022  Allergen Reaction Noted   Latex Other (See Comments) 08/25/2011    Family History  Problem Relation Age of Onset   Hyperlipidemia Mother    Hypertension Mother    Colon polyps Father 62   Cancer - Other Father 54       Laryngeal Cancer-uses speaking device   Alcohol abuse Brother    Colon polyps Brother 77   Colon cancer Neg Hx    Stomach cancer Neg Hx    Esophageal cancer Neg Hx    Rectal cancer Neg Hx     Social History   Socioeconomic History   Marital status: Married    Spouse name: Not on file   Number of children:  Not on file   Years of education: Not on file   Highest education level: Not on file  Occupational History   Not on file  Tobacco Use   Smoking status: Never   Smokeless tobacco: Never  Vaping Use   Vaping Use: Never used  Substance and Sexual Activity   Alcohol use: No   Drug use: No   Sexual activity: Not on file  Other Topics Concern   Not on file  Social History Narrative   Not on file   Social Determinants of Health   Financial Resource Strain: Not on file  Food Insecurity: Not on file  Transportation Needs: Not on file  Physical Activity: Not on file  Stress: Not on file  Social Connections: Not on file  Intimate Partner Violence: Not on file    Review of Systems:  All other review of systems negative except as mentioned in the HPI.  Physical Exam: Vital signs in last 24 hours: Blood Pressure (Abnormal) 152/82   Pulse 73   Temperature 98.3 F (36.8 C)   Respiration 14   Height '5\' 5"'$  (1.651 m)   Weight 207 lb (93.9 kg)   Oxygen Saturation 98%   Body Mass Index 34.45 kg/m  General:   Alert, NAD Lungs:  Clear .   Heart:  Regular rate and rhythm Abdomen:  Soft, nontender and nondistended. Neuro/Psych:  Alert and cooperative. Normal mood and affect. A and O x 3  Reviewed labs, radiology imaging, old records and pertinent past GI work up  Patient is appropriate for planned procedure(s) and anesthesia in an ambulatory setting   K. Denzil Magnuson , MD [phone]

## 2022-10-13 ENCOUNTER — Telehealth: Payer: Self-pay | Admitting: *Deleted

## 2022-10-13 NOTE — Telephone Encounter (Signed)
Post procedure follow up phone call. No answer at number given.  Left message on voicemail.  

## 2022-10-23 ENCOUNTER — Other Ambulatory Visit: Payer: Self-pay | Admitting: Internal Medicine

## 2022-10-23 ENCOUNTER — Encounter: Payer: Self-pay | Admitting: Gastroenterology

## 2022-10-24 ENCOUNTER — Other Ambulatory Visit: Payer: Self-pay

## 2022-10-24 ENCOUNTER — Other Ambulatory Visit (HOSPITAL_COMMUNITY): Payer: Self-pay

## 2022-10-24 MED ORDER — ERGOCALCIFEROL 1.25 MG (50000 UT) PO CAPS
50000.0000 [IU] | ORAL_CAPSULE | ORAL | 3 refills | Status: DC
  Filled 2022-10-24: qty 12, 84d supply, fill #0
  Filled 2023-07-24: qty 12, 84d supply, fill #1

## 2022-12-16 DIAGNOSIS — H906 Mixed conductive and sensorineural hearing loss, bilateral: Secondary | ICD-10-CM | POA: Diagnosis not present

## 2023-01-03 ENCOUNTER — Other Ambulatory Visit (HOSPITAL_COMMUNITY): Payer: Self-pay

## 2023-01-05 NOTE — Progress Notes (Signed)
Patient Care Team: Margaree Mackintosh, MD as PCP - General (Internal Medicine)  Visit Date: 01/16/23  Subjective:    Patient ID: [name] , Female   DOB: [birth year], 67 y.o.    MRN: [id number]   67 y.o. Female presents today for a 6 month follow-up. Patient has a past medical history of asthma, hyperlipidemia, osteopenia.  History of hyperlipidemia treated with rosuvastatin 20 mg daily, fenofibrate 145 mg daily. Lipid panel normal on 01/09/23.  History of musculoskeletal pain treated with cyclobenzaprine 10 mg three times daily as needed.  History of Vitamin D deficiency treated with ergocalciferol 50,000 units weekly.  Liver function normal. TSH at 3.79.  Past Medical History:  Diagnosis Date   Asthma    NO PROBLEMS IN4/5 YRS   Hyperlipidemia    on meds   Osteopenia      Family History  Problem Relation Age of Onset   Hyperlipidemia Mother    Hypertension Mother    Colon polyps Father 20   Cancer - Other Father 53       Laryngeal Cancer-uses speaking device   Alcohol abuse Brother    Colon polyps Brother 18   Colon cancer Neg Hx    Stomach cancer Neg Hx    Esophageal cancer Neg Hx    Rectal cancer Neg Hx     Social History   Social History Narrative   Not on file      Review of Systems  Constitutional:  Negative for fever and malaise/fatigue.  HENT:  Negative for congestion.   Eyes:  Negative for blurred vision.  Respiratory:  Negative for cough and shortness of breath.   Cardiovascular:  Negative for chest pain, palpitations and leg swelling.  Gastrointestinal:  Negative for vomiting.  Musculoskeletal:  Negative for back pain.  Skin:  Negative for rash.  Neurological:  Negative for loss of consciousness and headaches.        Objective:   Vitals: BP 116/68   Pulse 82   Temp 97.7 F (36.5 C) (Tympanic)   Ht 5\' 5"  (1.651 m)   Wt 219 lb (99.3 kg)   SpO2 98%   BMI 36.44 kg/m    Physical Exam Vitals and nursing note reviewed.   Constitutional:      General: She is not in acute distress.    Appearance: Normal appearance. She is not toxic-appearing.  HENT:     Head: Normocephalic and atraumatic.  Cardiovascular:     Rate and Rhythm: Normal rate and regular rhythm. No extrasystoles are present.    Pulses: Normal pulses.     Heart sounds: Normal heart sounds. No murmur heard.    No friction rub. No gallop.  Pulmonary:     Effort: Pulmonary effort is normal. No respiratory distress.     Breath sounds: Normal breath sounds. No wheezing or rales.  Skin:    General: Skin is warm and dry.  Neurological:     Mental Status: She is alert and oriented to person, place, and time. Mental status is at baseline.  Psychiatric:        Mood and Affect: Mood normal.        Behavior: Behavior normal.        Thought Content: Thought content normal.        Judgment: Judgment normal.       Results:   Studies obtained and personally reviewed by me:  T-score -1.3 right femur neck on 01/17/21.  Labs:  Component Value Date/Time   NA 141 07/11/2022 0957   K 4.4 07/11/2022 0957   CL 105 07/11/2022 0957   CO2 28 07/11/2022 0957   GLUCOSE 96 07/11/2022 0957   BUN 16 07/11/2022 0957   CREATININE 1.03 07/11/2022 0957   CALCIUM 9.8 07/11/2022 0957   PROT 7.3 01/09/2023 0908   ALBUMIN 4.4 10/21/2016 1043   AST 20 01/09/2023 0908   ALT 22 01/09/2023 0908   ALKPHOS 85 10/21/2016 1043   BILITOT 0.5 01/09/2023 0908   GFRNONAA 70 07/06/2020 0919   GFRAA 81 07/06/2020 0919     Lab Results  Component Value Date   WBC 6.8 07/11/2022   HGB 13.0 07/11/2022   HCT 39.3 07/11/2022   MCV 85.8 07/11/2022   PLT 356 07/11/2022    Lab Results  Component Value Date   CHOL 155 01/09/2023   HDL 61 01/09/2023   LDLCALC 76 01/09/2023   TRIG 96 01/09/2023   CHOLHDL 2.5 01/09/2023    No results found for: "HGBA1C"   Lab Results  Component Value Date   TSH 3.79 01/09/2023      Assessment & Plan:   Hyperlipidemia:  treated with rosuvastatin 20 mg daily, fenofibrate 145 mg daily. Lipid panel normal on 01/09/23.  Musculoskeletal pain: treated with cyclobenzaprine 10 mg three times daily as needed. Refilled.  Vitamin D deficiency: treated with ergocalciferol 50,000 units weekly.  Osteopenia: T-score -1.3 right femur neck on 01/17/21. Will repeat in 2024.  Vaccine counseling: discussed vaccines.  Return in 6 months for health maintenance exam.    I,Alexander Ruley,acting as a scribe for Margaree Mackintosh, MD.,have documented all relevant documentation on the behalf of Margaree Mackintosh, MD,as directed by  Margaree Mackintosh, MD while in the presence of Margaree Mackintosh, MD.   I, Margaree Mackintosh, MD, have reviewed all documentation for this visit. The documentation on 02/02/23 for the exam, diagnosis, procedures, and orders are all accurate and complete.

## 2023-01-09 ENCOUNTER — Other Ambulatory Visit: Payer: 59

## 2023-01-09 DIAGNOSIS — E782 Mixed hyperlipidemia: Secondary | ICD-10-CM

## 2023-01-09 DIAGNOSIS — R7989 Other specified abnormal findings of blood chemistry: Secondary | ICD-10-CM

## 2023-01-10 LAB — LIPID PANEL
Cholesterol: 155 mg/dL (ref <200)
HDL: 61 mg/dL (ref >=50)
LDL Cholesterol (Calc): 76 mg/dL (calc)
Non-HDL Cholesterol (Calc): 94 mg/dL (calc) (ref <130)
Total CHOL/HDL Ratio: 2.5 (calc) (ref <5.0)
Triglycerides: 96 mg/dL (ref <150)

## 2023-01-10 LAB — HEPATIC FUNCTION PANEL
AG Ratio: 1.8 (calc) (ref 1.0–2.5)
ALT: 22 U/L (ref 6–29)
AST: 20 U/L (ref 10–35)
Albumin: 4.7 g/dL (ref 3.6–5.1)
Alkaline phosphatase (APISO): 54 U/L (ref 37–153)
Bilirubin, Direct: 0.1 mg/dL (ref 0.0–0.2)
Globulin: 2.6 g/dL (calc) (ref 1.9–3.7)
Indirect Bilirubin: 0.4 mg/dL (calc) (ref 0.2–1.2)
Total Bilirubin: 0.5 mg/dL (ref 0.2–1.2)
Total Protein: 7.3 g/dL (ref 6.1–8.1)

## 2023-01-10 LAB — TSH: TSH: 3.79 mIU/L (ref 0.40–4.50)

## 2023-01-16 ENCOUNTER — Encounter: Payer: Self-pay | Admitting: Internal Medicine

## 2023-01-16 ENCOUNTER — Ambulatory Visit: Payer: 59 | Admitting: Internal Medicine

## 2023-01-16 ENCOUNTER — Other Ambulatory Visit (HOSPITAL_COMMUNITY): Payer: Self-pay

## 2023-01-16 VITALS — BP 116/68 | HR 82 | Temp 97.7°F | Ht 65.0 in | Wt 219.0 lb

## 2023-01-16 DIAGNOSIS — M8580 Other specified disorders of bone density and structure, unspecified site: Secondary | ICD-10-CM

## 2023-01-16 DIAGNOSIS — M7918 Myalgia, other site: Secondary | ICD-10-CM | POA: Diagnosis not present

## 2023-01-16 DIAGNOSIS — E559 Vitamin D deficiency, unspecified: Secondary | ICD-10-CM | POA: Diagnosis not present

## 2023-01-16 DIAGNOSIS — E782 Mixed hyperlipidemia: Secondary | ICD-10-CM

## 2023-01-16 MED ORDER — CYCLOBENZAPRINE HCL 10 MG PO TABS
ORAL_TABLET | ORAL | 2 refills | Status: DC
  Filled 2023-01-16 – 2023-07-24 (×3): qty 90, 30d supply, fill #0
  Filled 2023-08-24: qty 90, 30d supply, fill #1
  Filled 2023-12-01: qty 90, 30d supply, fill #2

## 2023-01-24 ENCOUNTER — Other Ambulatory Visit (HOSPITAL_COMMUNITY): Payer: Self-pay

## 2023-02-02 NOTE — Patient Instructions (Addendum)
It was a pleasure to see you today. Labs are stable. Continue same meds and follow up in 6 months for wellness exam. No change in meds.Flexeril refilled.

## 2023-03-24 ENCOUNTER — Other Ambulatory Visit (HOSPITAL_COMMUNITY): Payer: Self-pay

## 2023-04-09 ENCOUNTER — Other Ambulatory Visit: Payer: Self-pay

## 2023-04-09 ENCOUNTER — Encounter: Payer: Self-pay | Admitting: Pharmacist

## 2023-04-09 ENCOUNTER — Other Ambulatory Visit (HOSPITAL_COMMUNITY): Payer: Self-pay

## 2023-04-14 ENCOUNTER — Other Ambulatory Visit: Payer: Self-pay

## 2023-07-16 ENCOUNTER — Other Ambulatory Visit (HOSPITAL_COMMUNITY): Payer: Self-pay

## 2023-07-16 ENCOUNTER — Other Ambulatory Visit: Payer: Self-pay | Admitting: Internal Medicine

## 2023-07-16 MED ORDER — ONDANSETRON HCL 4 MG PO TABS
4.0000 mg | ORAL_TABLET | Freq: Three times a day (TID) | ORAL | 0 refills | Status: AC | PRN
  Filled 2023-07-24: qty 20, 7d supply, fill #0

## 2023-07-16 MED ORDER — FENOFIBRATE 145 MG PO TABS
145.0000 mg | ORAL_TABLET | Freq: Every day | ORAL | 1 refills | Status: DC
  Filled 2023-07-24: qty 90, 90d supply, fill #0
  Filled 2023-12-01: qty 90, 90d supply, fill #1

## 2023-07-16 NOTE — Progress Notes (Signed)
Patient Care Team: Margaree Mackintosh, MD as PCP - General (Internal Medicine)  Visit Date: 07/23/23  Subjective:    Patient ID: [name] , Female   DOB: [birth year], 67 y.o.    MRN: [id number]   67 y.o. Female presents today for annual comprehensive physical exam. History of asthma, hyperlipidemia, osteopenia.   History of hyperlipidemia treated with rosuvastatin 20 mg daily, fenofibrate 145 mg daily. Lipid panel is  normal.   History of impaired glucose tolerance. HGBA1c at 6.5%.   History of Vitamin D deficiency treated with Drisdol 50,000 units weekly.  History of musculoskeletal pain for which she has taken Flexeril from time to time.  History of occasional migraine headaches treated with Zofran.  History of right shoulder impingement previously seen by Dr. Rennis Chris.  She had right shoulder arthroscopy December 2019 and did well.   She is allergic to bee stings.  Has been allergy tested in the past showing minimal positivity for mold.  Spirometry showed significant postbronchodilator improvement.   She had a false positive HIV test done at the [facility] in October 2000, and unfortunately is no longer able to donate blood.   History of left lower lobe pneumonia March 2006.  History of cervical disc disease with bulges C7-T1.  History of anterior cervical discectomy and decompression with fusion for right C8 radiculopathy done by Dr. Venetia Maxon in 2014.   History of mild osteopenia with bone density study May 2022 showing lowest T-score right femoral neck -1.3.  This is consistent with very mild osteopenia.  07/17/23 labs reviewed. Glucose normal at 108. Kidney, liver functions normal. Electrolytes normal. Blood proteins normal. CBC normal. TSH at 2.88.  Mammogram normal on 09/18/22.  Mixed conductive and sensorineural hearing loss. Seen by Audilologist.  Colonoscopy last completed 10/10/22. Showed three 4-7 mm polyps in ascending colon, non-bleeding external and internal  hemorrhoids. Pathology showed three tubular adenomas. Repeat recommended in 2027.  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  She has a Naval architect and a Event organiser.  She is married.  Does not smoke or consume alcohol.  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   Family history: Father's family history is not known.  Mother with history of hypertension, hyperlipidemia and cardiac arrhythmia.  3 brothers.  No sisters.  She has 2 children, a son and a daughter, both adults in good health.  Past Medical History:  Diagnosis Date   Asthma    NO PROBLEMS IN4/5 YRS   Hyperlipidemia    on meds   Osteopenia      Family History  Problem Relation Age of Onset   Hyperlipidemia Mother    Hypertension Mother    Colon polyps Father 33   Cancer - Other Father 74       Laryngeal Cancer-uses speaking device   Alcohol abuse Brother    Colon polyps Brother 67   Colon cancer Neg Hx    Stomach cancer Neg Hx    Esophageal cancer Neg Hx    Rectal cancer Neg Hx     Social Hx: married. Works as a Paramedic for [employer] and has had a long successful career there. Has a college education and holds a Masters degree. Does not smoke or consume alcohol. 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     Review of Systems  Constitutional:  Negative for chills, fever, malaise/fatigue and weight loss.  HENT:  Negative  for hearing loss, sinus pain and sore throat.   Respiratory:  Negative for cough, hemoptysis and shortness of breath.   Cardiovascular:  Negative for chest pain, palpitations, leg swelling and PND.  Gastrointestinal:  Negative for abdominal pain, constipation, diarrhea, heartburn, nausea and vomiting.  Genitourinary:  Negative for dysuria, frequency and urgency.  Musculoskeletal:  Negative for back pain, myalgias and neck pain.  Skin:  Negative for itching and rash.  Neurological:  Negative for dizziness, tingling, seizures  and headaches.  Endo/Heme/Allergies:  Negative for polydipsia.  Psychiatric/Behavioral:  Negative for depression. The patient is not nervous/anxious.         Objective:   Vitals: BP 120/80   Pulse 80   Ht 5\' 5"  (1.651 m)   Wt 218 lb (98.9 kg)   SpO2 98%   BMI 36.28 kg/m    Physical Exam Vitals and nursing note reviewed.  Constitutional:      General: She is not in acute distress.    Appearance: Normal appearance. She is not ill-appearing or toxic-appearing.  HENT:     Head: Normocephalic and atraumatic.     Right Ear: Hearing, tympanic membrane, ear canal and external ear normal.     Left Ear: Hearing, tympanic membrane, ear canal and external ear normal.     Mouth/Throat:     Pharynx: Oropharynx is clear.  Eyes:     Extraocular Movements: Extraocular movements intact.     Pupils: Pupils are equal, round, and reactive to light.  Neck:     Thyroid: No thyroid mass, thyromegaly or thyroid tenderness.     Vascular: No carotid bruit.  Cardiovascular:     Rate and Rhythm: Normal rate and regular rhythm. No extrasystoles are present.    Pulses:          Dorsalis pedis pulses are 1+ on the right side and 1+ on the left side.     Heart sounds: Normal heart sounds. No murmur heard.    No friction rub. No gallop.  Pulmonary:     Effort: Pulmonary effort is normal.     Breath sounds: Normal breath sounds. No decreased breath sounds, wheezing, rhonchi or rales.  Chest:     Chest wall: No mass.  Breasts:    Right: No mass.     Left: No mass.  Abdominal:     General: There is no distension.     Palpations: Abdomen is soft. There is no hepatomegaly, splenomegaly or mass.     Tenderness: There is no abdominal tenderness.     Hernia: No hernia is present.     Comments: Abdomen obese.  Genitourinary:    Comments: NFEG. Bimanual exam normal- rectovaginal confirms. No stool to guaiac. Musculoskeletal:     Cervical back: Normal range of motion.     Right lower leg: No edema.      Left lower leg: No edema.  Lymphadenopathy:     Cervical: No cervical adenopathy.     Upper Body:     Right upper body: No supraclavicular adenopathy.     Left upper body: No supraclavicular adenopathy.  Skin:    General: Skin is warm and dry.  Neurological:     General: No focal deficit present.     Mental Status: She is alert and oriented to person, place, and time. Mental status is at baseline.     Sensory: Sensation is intact.     Motor: Motor function is intact. No weakness.     Deep Tendon Reflexes:  Reflexes are normal and symmetric.  Psychiatric:        Attention and Perception: Attention normal.        Mood and Affect: Mood normal.        Speech: Speech normal.        Behavior: Behavior normal.        Thought Content: Thought content normal.        Cognition and Memory: Cognition normal.        Judgment: Judgment normal.       Results:   Studies obtained and personally reviewed by me:  Mammogram normal on 09/18/22.  Colonoscopy last completed 10/10/22. Showed three 4-7 mm polyps in ascending colon, non-bleeding external and internal hemorrhoids. Pathology showed three tubular adenomas. Repeat recommended in 2027.  Labs:       Component Value Date/Time   NA 144 07/17/2023 0937   K 4.6 07/17/2023 0937   CL 106 07/17/2023 0937   CO2 29 07/17/2023 0937   GLUCOSE 108 (H) 07/17/2023 0937   BUN 16 07/17/2023 0937   CREATININE 0.88 07/17/2023 0937   CALCIUM 10.2 07/17/2023 0937   PROT 7.0 07/17/2023 0937   ALBUMIN 4.4 10/21/2016 1043   AST 22 07/17/2023 0937   ALT 21 07/17/2023 0937   ALKPHOS 85 10/21/2016 1043   BILITOT 0.4 07/17/2023 0937   GFRNONAA 70 07/06/2020 0919   GFRAA 81 07/06/2020 0919     Lab Results  Component Value Date   WBC 7.3 07/17/2023   HGB 13.0 07/17/2023   HCT 40.6 07/17/2023   MCV 90.0 07/17/2023   PLT 379 07/17/2023    Lab Results  Component Value Date   CHOL 140 07/17/2023   HDL 57 07/17/2023   LDLCALC 66 07/17/2023    TRIG 87 07/17/2023   CHOLHDL 2.5 07/17/2023    Lab Results  Component Value Date   HGBA1C 6.5 (H) 07/17/2023     Lab Results  Component Value Date   TSH 2.88 07/17/2023      Assessment & Plan:   Hyperlipidemia: treated with rosuvastatin 20 mg daily, fenofibrate 145 mg daily. Lipid panel normal.   Impaired glucose tolerance: she is interested in GLP-1 medications. HGBA1c at 6.5%.   Vitamin D deficiency: treated with Drisdol 50,000 units weekly.  Musculoskeletal pain: treated with Flexeril as needed.    Migraine headaches: treated with Zofran as needed.   Mild osteopenia: bone density study May 2022 showing lowest T-score right femoral neck -1.3.    Mixed conductive and sensorineural hearing loss seen by Audiologist.  Urinalysis normal today.  Mammogram normal on 09/18/22.  Colonoscopy last completed 10/10/22. Showed three 4-7 mm polyps in ascending colon, non-bleeding external and internal hemorrhoids. Pathology showed three tubular adenomas. Repeat recommended in 2027.  Vaccine counseling: UTD on tetanus, shingles vaccines. Reports she is UTD on flu, Covid-19 boosters.  Return in 6 months for follow-up. Endocrinology referral to be considered.    I,Alexander Ruley,acting as a Neurosurgeon for Margaree Mackintosh, MD.,have documented all relevant documentation on the behalf of Margaree Mackintosh, MD,as directed by  Margaree Mackintosh, MD while in the presence of Margaree Mackintosh, MD.   I, Margaree Mackintosh, MD, have reviewed all documentation for this visit. The documentation on 08/02/23 for the exam, diagnosis, procedures, and orders are all accurate and complete.

## 2023-07-17 ENCOUNTER — Other Ambulatory Visit: Payer: 59

## 2023-07-17 DIAGNOSIS — R7989 Other specified abnormal findings of blood chemistry: Secondary | ICD-10-CM | POA: Diagnosis not present

## 2023-07-17 DIAGNOSIS — E559 Vitamin D deficiency, unspecified: Secondary | ICD-10-CM

## 2023-07-17 DIAGNOSIS — Z0000 Encounter for general adult medical examination without abnormal findings: Secondary | ICD-10-CM | POA: Diagnosis not present

## 2023-07-17 DIAGNOSIS — M8580 Other specified disorders of bone density and structure, unspecified site: Secondary | ICD-10-CM

## 2023-07-17 DIAGNOSIS — E782 Mixed hyperlipidemia: Secondary | ICD-10-CM | POA: Diagnosis not present

## 2023-07-17 DIAGNOSIS — R7309 Other abnormal glucose: Secondary | ICD-10-CM | POA: Diagnosis not present

## 2023-07-20 ENCOUNTER — Other Ambulatory Visit: Payer: Self-pay | Admitting: Medical Genetics

## 2023-07-20 DIAGNOSIS — Z006 Encounter for examination for normal comparison and control in clinical research program: Secondary | ICD-10-CM

## 2023-07-21 NOTE — Addendum Note (Signed)
Addended by: Gregery Na on: 07/21/2023 02:58 PM   Modules accepted: Orders

## 2023-07-22 LAB — LIPID PANEL
Cholesterol: 140 mg/dL (ref <200)
HDL: 57 mg/dL (ref >=50)
LDL Cholesterol (Calc): 66 mg/dL
Non-HDL Cholesterol (Calc): 83 mg/dL (ref <130)
Total CHOL/HDL Ratio: 2.5 (calc) (ref <5.0)
Triglycerides: 87 mg/dL (ref <150)

## 2023-07-22 LAB — TEST AUTHORIZATION

## 2023-07-22 LAB — CBC WITH DIFFERENTIAL/PLATELET
Absolute Lymphocytes: 2343 {cells}/uL (ref 850–3900)
Absolute Monocytes: 496 {cells}/uL (ref 200–950)
Basophils Absolute: 51 {cells}/uL (ref 0–200)
Basophils Relative: 0.7 %
Eosinophils Absolute: 80 {cells}/uL (ref 15–500)
Eosinophils Relative: 1.1 %
HCT: 40.6 % (ref 35.0–45.0)
Hemoglobin: 13 g/dL (ref 11.7–15.5)
MCH: 28.8 pg (ref 27.0–33.0)
MCHC: 32 g/dL (ref 32.0–36.0)
MCV: 90 fL (ref 80.0–100.0)
MPV: 10.3 fL (ref 7.5–12.5)
Monocytes Relative: 6.8 %
Neutro Abs: 4329 {cells}/uL (ref 1500–7800)
Neutrophils Relative %: 59.3 %
Platelets: 379 10*3/uL (ref 140–400)
RBC: 4.51 10*6/uL (ref 3.80–5.10)
RDW: 13 % (ref 11.0–15.0)
Total Lymphocyte: 32.1 %
WBC: 7.3 10*3/uL (ref 3.8–10.8)

## 2023-07-22 LAB — COMPLETE METABOLIC PANEL WITH GFR
AG Ratio: 1.9 (calc) (ref 1.0–2.5)
ALT: 21 U/L (ref 6–29)
AST: 22 U/L (ref 10–35)
Albumin: 4.6 g/dL (ref 3.6–5.1)
Alkaline phosphatase (APISO): 55 U/L (ref 37–153)
BUN: 16 mg/dL (ref 7–25)
CO2: 29 mmol/L (ref 20–32)
Calcium: 10.2 mg/dL (ref 8.6–10.4)
Chloride: 106 mmol/L (ref 98–110)
Creat: 0.88 mg/dL (ref 0.50–1.05)
Globulin: 2.4 g/dL (ref 1.9–3.7)
Glucose, Bld: 108 mg/dL — ABNORMAL HIGH (ref 65–99)
Potassium: 4.6 mmol/L (ref 3.5–5.3)
Sodium: 144 mmol/L (ref 135–146)
Total Bilirubin: 0.4 mg/dL (ref 0.2–1.2)
Total Protein: 7 g/dL (ref 6.1–8.1)
eGFR: 72 mL/min/{1.73_m2} (ref >=60)

## 2023-07-22 LAB — TSH: TSH: 2.88 m[IU]/L (ref 0.40–4.50)

## 2023-07-22 LAB — HEMOGLOBIN A1C W/OUT EAG: Hgb A1c MFr Bld: 6.5 %{Hb} — ABNORMAL HIGH (ref <5.7)

## 2023-07-23 ENCOUNTER — Encounter: Payer: Self-pay | Admitting: Internal Medicine

## 2023-07-23 ENCOUNTER — Ambulatory Visit: Payer: 59 | Admitting: Internal Medicine

## 2023-07-23 VITALS — BP 120/80 | HR 80 | Ht 65.0 in | Wt 218.0 lb

## 2023-07-23 DIAGNOSIS — H903 Sensorineural hearing loss, bilateral: Secondary | ICD-10-CM | POA: Diagnosis not present

## 2023-07-23 DIAGNOSIS — Z0000 Encounter for general adult medical examination without abnormal findings: Secondary | ICD-10-CM | POA: Diagnosis not present

## 2023-07-23 DIAGNOSIS — Z8669 Personal history of other diseases of the nervous system and sense organs: Secondary | ICD-10-CM | POA: Diagnosis not present

## 2023-07-23 DIAGNOSIS — E559 Vitamin D deficiency, unspecified: Secondary | ICD-10-CM | POA: Diagnosis not present

## 2023-07-23 DIAGNOSIS — M7918 Myalgia, other site: Secondary | ICD-10-CM

## 2023-07-23 DIAGNOSIS — M8580 Other specified disorders of bone density and structure, unspecified site: Secondary | ICD-10-CM | POA: Diagnosis not present

## 2023-07-23 DIAGNOSIS — E7439 Other disorders of intestinal carbohydrate absorption: Secondary | ICD-10-CM | POA: Diagnosis not present

## 2023-07-23 DIAGNOSIS — E782 Mixed hyperlipidemia: Secondary | ICD-10-CM

## 2023-07-23 DIAGNOSIS — Z6836 Body mass index (BMI) 36.0-36.9, adult: Secondary | ICD-10-CM | POA: Diagnosis not present

## 2023-07-23 LAB — POCT URINALYSIS DIP (CLINITEK)
Bilirubin, UA: NEGATIVE
Blood, UA: NEGATIVE
Glucose, UA: NEGATIVE mg/dL
Ketones, POC UA: NEGATIVE mg/dL
Leukocytes, UA: NEGATIVE
Nitrite, UA: NEGATIVE
POC PROTEIN,UA: NEGATIVE
Spec Grav, UA: 1.015 (ref 1.010–1.025)
Urobilinogen, UA: 0.2 U/dL
pH, UA: 6 (ref 5.0–8.0)

## 2023-07-24 ENCOUNTER — Other Ambulatory Visit (HOSPITAL_COMMUNITY): Payer: Self-pay

## 2023-08-02 NOTE — Patient Instructions (Signed)
It was a pleasure to see you today. Please continue current meds. Let's give thought to seeing Endocrinologist about GLP-1 medication for glucose control and weight loss.

## 2023-08-24 ENCOUNTER — Encounter: Payer: Self-pay | Admitting: Internal Medicine

## 2023-08-28 ENCOUNTER — Other Ambulatory Visit (HOSPITAL_COMMUNITY): Payer: Self-pay

## 2023-09-23 ENCOUNTER — Ambulatory Visit: Payer: Self-pay | Admitting: Internal Medicine

## 2023-09-23 ENCOUNTER — Encounter: Payer: Self-pay | Admitting: Family Medicine

## 2023-09-23 ENCOUNTER — Ambulatory Visit (INDEPENDENT_AMBULATORY_CARE_PROVIDER_SITE_OTHER): Payer: 59

## 2023-09-23 ENCOUNTER — Ambulatory Visit: Payer: 59 | Admitting: Family Medicine

## 2023-09-23 ENCOUNTER — Other Ambulatory Visit (HOSPITAL_COMMUNITY): Payer: Self-pay

## 2023-09-23 VITALS — BP 170/82 | HR 86 | Ht 65.0 in | Wt 219.0 lb

## 2023-09-23 DIAGNOSIS — M25461 Effusion, right knee: Secondary | ICD-10-CM | POA: Diagnosis not present

## 2023-09-23 DIAGNOSIS — M25561 Pain in right knee: Secondary | ICD-10-CM

## 2023-09-23 DIAGNOSIS — M1711 Unilateral primary osteoarthritis, right knee: Secondary | ICD-10-CM

## 2023-09-23 DIAGNOSIS — R030 Elevated blood-pressure reading, without diagnosis of hypertension: Secondary | ICD-10-CM

## 2023-09-23 MED ORDER — DICLOFENAC SODIUM 1 % EX GEL
2.0000 g | Freq: Four times a day (QID) | CUTANEOUS | 3 refills | Status: AC
  Filled 2023-09-23: qty 300, 20d supply, fill #0

## 2023-09-23 MED ORDER — DICLOFENAC SODIUM 1 % EX GEL: 2.0000 g | Freq: Four times a day (QID) | CUTANEOUS | 3 refills | Status: DC

## 2023-09-23 NOTE — Progress Notes (Signed)
BP (!) 170/82   Pulse 86   Ht 5\' 5"  (1.651 m)   Wt 99.3 kg   SpO2 97%   BMI 36.44 kg/m    Subjective:   Patient ID: [name], female    DOB: [birth year], 68 y.o.   MRN: [id number]  HPI: [name] is a 68 y.o. female presenting on 09/23/2023 for Leg Pain (Right knee feels tight stiff. Right foot feels numb) and Hypertension   Leg Pain  Associated symptoms include numbness.  Hypertension Pertinent negatives include no chest pain or shortness of breath.   Comes to this office with one month of right lower extremity stiffness and pressure. Felt more on the medial and lateral aspect of the knee and travels to her foot. Has tried Advil with no relief. The symptoms do not radiate to any other area. She is usually sedentary but does ambulate some. Has required more siting than normal due to changes at work. Denies any recent surgeries or Trauma.  Blood pressure has been running up since she has not been feeling well in her leg.  Relevant past medical, surgical, family and social history reviewed and updated as indicated. Interim medical history since our last visit reviewed. Allergies and medications reviewed and updated.  Review of Systems  Constitutional:  Negative for fatigue and fever.  Respiratory:  Negative for cough and shortness of breath.   Cardiovascular:  Negative for chest pain and leg swelling.  Genitourinary:  Negative for difficulty urinating and dysuria.  Musculoskeletal:  Positive for myalgias. Negative for back pain.  Skin:  Negative for rash.  Neurological:  Positive for numbness. Negative for weakness and light-headedness.  Psychiatric/Behavioral:  Negative for behavioral problems.   All other systems reviewed and are negative.   Per HPI unless specifically indicated above   Allergies as of 09/23/2023       Reactions   Latex Other (See Comments)   Patient states that it just bothers her skin a little bit.        Medication List         Accurate as of September 23, 2023  1:54 PM. If you have any questions, ask your nurse or doctor.          CALCIUM 1200 PO Take by mouth 2 (two) times daily. 1200/1000iu   cyclobenzaprine 10 MG tablet Commonly known as: FLEXERIL TAKE 1 TABLET BY MOUTH THREE TIMES A DAY AS NEEDED FOR MUSCLE SPASMS   fenofibrate 145 MG tablet Commonly known as: Tricor Take 1 tablet by mouth daily.   ondansetron 4 MG tablet Commonly known as: Zofran Take 1 tablet (4 mg total) by mouth every 8 (eight) hours as needed for nausea or vomiting.   rosuvastatin 20 MG tablet Commonly known as: Crestor Take 1 tablet (20 mg total) by mouth daily.   Vitamin D (Ergocalciferol) 1.25 MG (50000 UNIT) Caps capsule Commonly known as: Drisdol Take 1 capsule  by mouth once a week.         Objective:   BP (!) 170/82   Pulse 86   Ht 5\' 5"  (1.651 m)   Wt 99.3 kg   SpO2 97%   BMI 36.44 kg/m   Wt Readings from Last 3 Encounters:  09/23/23 99.3 kg  07/23/23 98.9 kg  01/16/23 99.3 kg    Physical Exam Constitutional:      General: She is not in acute distress.    Appearance: Normal appearance. She is well-developed. She is not diaphoretic.  Cardiovascular:     Rate and Rhythm: Normal rate and regular rhythm.     Heart sounds: Normal heart sounds.  Pulmonary:     Effort: Pulmonary effort is normal. No respiratory distress.     Breath sounds: Normal breath sounds.  Musculoskeletal:     Lumbar back: Negative right straight leg raise test.     Right knee: No deformity, erythema or crepitus. Normal range of motion. Tenderness present over the medial joint line and lateral joint line.     Left knee: Normal.     Right lower leg: Normal.     Left lower leg: Normal.     Right foot: Normal.     Left foot: Normal.  Skin:    General: Skin is warm and dry.  Neurological:     Mental Status: She is alert and oriented to person, place, and time.     Sensory: No sensory deficit.     Motor: No weakness.      Gait: Gait normal.  Psychiatric:        Behavior: Behavior normal.       Assessment & Plan:   Problem List Items Addressed This Visit   None Visit Diagnoses       Acute pain of right knee    -  Primary   Relevant Medications   diclofenac Sodium (VOLTAREN) 1 % GEL   Other Relevant Orders   DG Knee 1-2 Views Right (Completed)     Primary osteoarthritis of right knee       Relevant Medications   diclofenac Sodium (VOLTAREN) 1 % GEL     Elevated blood pressure reading           2 view x-ray of the RIGHT knee reveals bone spurs and medial compartmental narrowing. Treatment options expressed to patient. Will give Diclofenac Sodium 2g topically four times daily. BP assessed at current visit is 165/79 compared to 120/80 at last PCP visit. Advised her to monitor BP for the next 2 weeks and notify PCP if they continue to be elevated. Follow up plan: Notify PCP if symptoms return or fail to improve.   Counseling provided for all of the vaccine components Orders Placed This Encounter  Procedures   DG Knee 1-2 Views Right    Maximino Sarin PA-S 09/23/2023, 1:54 PM  I was personally present for all components of the history, physical exam and/or medical decision making.  I agree with the documentation performed by the student and agree with assessment and plan above.  Will try Voltaren gel and she will monitor blood pressures at home. Arville Care, MD [facility] 10/01/2023, 8:11 AM

## 2023-09-23 NOTE — Telephone Encounter (Addendum)
 Copied from CRM [id number]. Topic: Clinical - Red Word Triage >> Sep 23, 2023 11:16 AM Tisa Forester wrote: Red Word that prompted transfer to Nurse Triage: numbness in right leg and foot been going on for about a month, patient stated she sit a lot at work ,when get up from sitting the leg is real stiff especially in the knee area  Chief Complaint: Right leg tightness/numbness Symptoms: Right leg tightness/numbness, elevated BP Frequency: About a month Pertinent Negatives: Patient denies slurred speech, facial drooping, blurred vision, and numbness in arms Disposition: [] ED /[] Urgent Care (no appt availability in office) / [x] Appointment(In office/virtual)/ []  [facility]/ [] Home Care/ [] Refused Recommended Disposition /[] [facility]/ []  Follow-up with PCP Additional Notes: Patient called in to report tightness and numbness in her right leg that has been ongoing for the past month. 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 Patient denied tightness and numbness in right leg while sitting and stated it only happens when she stands up. Patient denied pain. Patient denied slurred speech, facial drooping, blurred vision, and numbness in arms. Patient stated that her BP has been slightly elevated recently. This RN had patient take BP while on the phone and the reading was 205/102. Patient noticeably nervous while on the phone with this RN and stated that her BP normally runs higher in these situations, due to "white coat syndrome". 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 Advised patient to see a provider within 24 hours. No availability today with patient's current PCP. Scheduled patient at a different office for early this afternoon. Advised patient to call back if symptoms become more severe. Patient complied.   Reason for Disposition   Numbness in a leg or foot (i.e., loss of sensation)  Answer Assessment - Initial Assessment Questions 1. ONSET: "When did the pain start?"      About a month ago  2. LOCATION: "Where is the pain located?"      Right leg  3. PAIN: "How bad is the pain?"    (Scale 1-10; or mild, moderate, severe)   -  MILD (1-3): doesn't interfere with normal activities    -  MODERATE (4-7): interferes with normal activities (e.g., work or school) or awakens from sleep, limping    -  SEVERE (8-10): excruciating pain, unable to do any normal activities, unable to walk     Denies pain, but states tightness is present   4. 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 CAUSE: "What do you think is causing the leg pain?"     Reports right leg feels stiff and tight when she stands after sitting for long periods of time, denies stiffness while sitting  6. OTHER SYMPTOMS: "Do you have any other symptoms?" (e.g., chest pain, back pain, breathing difficulty, swelling, rash, fever, numbness, weakness)     Headache within past week that has gone away, BP slightly elevated past week, but measured 205/102 while on phone with this RN (patient was also very nervous)  Denies slurred speech, facial drooping, blurred  vision, and numbness in arms  Protocols used: Leg Pain-A-AH

## 2023-09-28 ENCOUNTER — Other Ambulatory Visit (HOSPITAL_COMMUNITY): Payer: Self-pay

## 2023-12-01 ENCOUNTER — Other Ambulatory Visit: Payer: Self-pay | Admitting: Internal Medicine

## 2023-12-01 ENCOUNTER — Other Ambulatory Visit (HOSPITAL_COMMUNITY): Payer: Self-pay

## 2023-12-01 MED ORDER — ROSUVASTATIN CALCIUM 20 MG PO TABS
20.0000 mg | ORAL_TABLET | Freq: Every day | ORAL | 0 refills | Status: DC
  Filled 2023-12-01: qty 90, 90d supply, fill #0

## 2023-12-01 MED ORDER — ERGOCALCIFEROL 1.25 MG (50000 UT) PO CAPS
50000.0000 [IU] | ORAL_CAPSULE | ORAL | 0 refills | Status: DC
Start: 2023-12-01 — End: 2024-03-01
  Filled 2023-12-01: qty 12, 84d supply, fill #0

## 2024-02-28 ENCOUNTER — Other Ambulatory Visit: Payer: Self-pay | Admitting: Internal Medicine

## 2024-02-28 DIAGNOSIS — R7989 Other specified abnormal findings of blood chemistry: Secondary | ICD-10-CM

## 2024-02-29 ENCOUNTER — Other Ambulatory Visit (HOSPITAL_COMMUNITY): Payer: Self-pay

## 2024-02-29 MED ORDER — ROSUVASTATIN CALCIUM 20 MG PO TABS
20.0000 mg | ORAL_TABLET | Freq: Every day | ORAL | 0 refills | Status: DC
  Filled 2024-02-29 – 2024-03-15 (×2): qty 90, 90d supply, fill #0

## 2024-02-29 MED ORDER — FENOFIBRATE 145 MG PO TABS
145.0000 mg | ORAL_TABLET | Freq: Every day | ORAL | 1 refills | Status: AC
  Filled 2024-02-29 – 2024-03-15 (×2): qty 90, 90d supply, fill #0
  Filled 2024-06-08: qty 90, 90d supply, fill #1

## 2024-03-01 ENCOUNTER — Other Ambulatory Visit: Payer: Self-pay | Admitting: Internal Medicine

## 2024-03-01 NOTE — Telephone Encounter (Signed)
 Please review refill request

## 2024-03-03 ENCOUNTER — Telehealth: Payer: Self-pay

## 2024-03-03 ENCOUNTER — Other Ambulatory Visit: Payer: Self-pay | Admitting: Internal Medicine

## 2024-03-03 DIAGNOSIS — Z1231 Encounter for screening mammogram for malignant neoplasm of breast: Secondary | ICD-10-CM

## 2024-03-03 NOTE — Telephone Encounter (Signed)
 Copied from CRM [id number]. Topic: Clinical - Request for Lab/Test Order >> Mar 03, 2024 10:25 AM [name] wrote: Reason for CRM: The patient is requesting lab orders for before her annual physical on 12/1.    Can you get her on schedule for this?

## 2024-03-10 ENCOUNTER — Ambulatory Visit

## 2024-03-14 ENCOUNTER — Other Ambulatory Visit (HOSPITAL_COMMUNITY): Payer: Self-pay

## 2024-03-15 ENCOUNTER — Other Ambulatory Visit (HOSPITAL_COMMUNITY): Payer: Self-pay

## 2024-03-16 ENCOUNTER — Other Ambulatory Visit (HOSPITAL_COMMUNITY): Payer: Self-pay

## 2024-03-16 MED ORDER — CYCLOBENZAPRINE HCL 10 MG PO TABS
ORAL_TABLET | ORAL | 2 refills | Status: AC
  Filled 2024-03-16: qty 90, 30d supply, fill #0
  Filled 2024-08-28: qty 90, 30d supply, fill #1

## 2024-03-16 MED ORDER — ERGOCALCIFEROL 1.25 MG (50000 UT) PO CAPS
50000.0000 [IU] | ORAL_CAPSULE | ORAL | 1 refills | Status: AC
  Filled 2024-03-16: qty 12, 84d supply, fill #0
  Filled 2024-08-28: qty 12, 84d supply, fill #1

## 2024-03-16 NOTE — Telephone Encounter (Signed)
 She is booked for her CPE/labs.

## 2024-03-18 ENCOUNTER — Ambulatory Visit
Admission: RE | Admit: 2024-03-18 | Discharge: 2024-03-18 | Disposition: A | Discharge status: Home / Self Care | Source: Ambulatory Visit | Attending: Internal Medicine | Admitting: Internal Medicine

## 2024-03-18 DIAGNOSIS — Z1231 Encounter for screening mammogram for malignant neoplasm of breast: Secondary | ICD-10-CM

## 2024-04-13 DIAGNOSIS — H906 Mixed conductive and sensorineural hearing loss, bilateral: Secondary | ICD-10-CM | POA: Diagnosis not present

## 2024-05-04 ENCOUNTER — Ambulatory Visit: Payer: Self-pay

## 2024-05-04 DIAGNOSIS — L299 Pruritus, unspecified: Secondary | ICD-10-CM | POA: Diagnosis not present

## 2024-05-04 DIAGNOSIS — T63441A Toxic effect of venom of bees, accidental (unintentional), initial encounter: Secondary | ICD-10-CM | POA: Diagnosis not present

## 2024-05-04 NOTE — Telephone Encounter (Signed)
 Copied from CRM [id number]. Topic: Clinical - Red Word Triage >> May 04, 2024  4:38 PM Dedra B wrote: Red Word that prompted transfer to Nurse Triage: Pt got stung by a bee yesterday on her L leg yesterday and the area is swelling and pain in the area. Leg feels tight. Warm transfer to [facility] nurse triage. Answer Assessment - Initial Assessment Questions 1. TYPE: What type of sting was it? (e.g., bee, yellow jacket, unknown)      Bee sting 2. ONSET: When did it occur?      Yesterday; Worse today then yesterday 3. LOCATION: Where is the sting located?  How many stings?     Left leg calf Unable to completely triage patient. Patient interrupted nurse and stated, I'm just going to go to Urgent Care, thank you. Then patient disconnected the call.  Protocols used: Bee or Yellow Jacket Sting-A-AH

## 2024-06-06 ENCOUNTER — Other Ambulatory Visit: Payer: Self-pay | Admitting: Internal Medicine

## 2024-06-06 DIAGNOSIS — R7989 Other specified abnormal findings of blood chemistry: Secondary | ICD-10-CM

## 2024-06-07 ENCOUNTER — Other Ambulatory Visit (HOSPITAL_COMMUNITY): Payer: Self-pay

## 2024-06-07 ENCOUNTER — Other Ambulatory Visit: Payer: Self-pay

## 2024-06-07 MED ORDER — ROSUVASTATIN CALCIUM 20 MG PO TABS
20.0000 mg | ORAL_TABLET | Freq: Every day | ORAL | 1 refills | Status: AC
  Filled 2024-06-07: qty 90, 90d supply, fill #0
  Filled 2024-08-28: qty 90, 90d supply, fill #1

## 2024-07-28 NOTE — Progress Notes (Signed)
 Annual Comprehensive Physical Exam   Patient Care Team: Perri Ronal PARAS, MD as PCP - General (Internal Medicine)  Visit Date: 08/08/24   Chief Complaint  Patient presents with   Annual Exam   Subjective:  Patient: [name], Female DOB: [birth year], 68 y.o. MRN: [id number] Vitals:   08/08/24 1503  BP: (!) 140/90   [name] is a 68 y.o. Female who presents today for her  Annual Comprehensive Physical Exam. Patient has Bee sting allergy; Allergic rhinitis; Osteopenia; Vitamin D  deficiency; Hypertriglyceridemia; and BMI 35.0-35.9,adult on their problem list.  History of hyperlipidemia treated with rosuvastatin  20 mg daily, fenofibrate  145 mg daily. 08/01/2024 Lipid panel normal .    History of impaired glucose tolerance. 08/01/2024 HGBA1c at 6.3%.    History of Vitamin D  deficiency treated with Drisdol  50,000 units weekly.   History of musculoskeletal pain for which she has taken Flexeril  from time to time. She said that she has not taken Flexeril  in a while.   History of occasional migraine headaches treated with Zofran . She has not felt nauseous recently.    History of right shoulder impingement previously seen by Dr. Melita.  She had right shoulder arthroscopy December 2019 and did well.   She is allergic to bee stings.  Has been allergy tested in the past showing minimal positivity for mold.  Spirometry showed significant postbronchodilator improvement.   She had a false positive HIV test done at the [facility] in October 2000, and unfortunately is no longer able to donate blood.   History of left lower lobe pneumonia March 2006.  History of cervical disc disease with bulges C7-T1.  History of anterior cervical discectomy and decompression with fusion for right C8 radiculopathy done by Dr. Unice in 2014.   Mixed conductive and sensorineural hearing loss. Seen by Audilologist.    Labs 08/01/2024  MCHC 31.3, Blood glucose 105, HgbA1c 6.3%, Otherwise WNL.    03/23/2024 Mammogram No mammographic evidence of malignancy. Repeat in one year.   10/10/2022 Colonoscopy Three 4 to 7 mm polyps in the ascending colon. Resected and retrieved. Pathology found to be benign but not precancerous. Non- bleeding external and internal hemorrhoids. The examination was otherwise normal. Repeat in 3 years.    Vaccine counseling: UTD on Influenza, Covid-19 and Pneumonia vaccines.  Health Maintenance  Topic Date Due   COVID-19 Vaccine (9 - Pfizer risk 2025-26 season) 12/07/2024   DTaP/Tdap/Td (3 - Td or Tdap) 04/25/2025   Colonoscopy  10/10/2025   Mammogram  03/18/2026   Pneumococcal Vaccine: 50+ Years  Completed   Influenza Vaccine  Completed   Bone Density Scan  Completed   Hepatitis C Screening  Completed   Zoster Vaccines- Shingrix  Completed   Meningococcal B Vaccine  Aged Out   Hepatitis B Vaccines 19-59 Average Risk  Discontinued    Review of Systems  Constitutional:  Negative for fever and malaise/fatigue.  HENT:  Negative for congestion.   Eyes:  Negative for blurred vision.  Respiratory:  Negative for cough and shortness of breath.   Cardiovascular:  Negative for chest pain, palpitations and leg swelling.  Gastrointestinal:  Negative for vomiting.  Musculoskeletal:  Negative for back pain.  Skin:  Negative for rash.  Neurological:  Negative for loss of consciousness and headaches.   Objective:  Vitals: body mass index is 35.45 kg/m. Today's Vitals   08/08/24 1503  BP: (!) 140/90  Pulse: 78  SpO2: 98%  Weight: 213 lb (96.6 kg)  Height: 5'  5 (1.651 m)  PainSc: 0-No pain   Physical Exam Vitals and nursing note reviewed. Exam conducted with a chaperone present Regena Big, CMA).  Constitutional:      General: She is not in acute distress.    Appearance: Normal appearance. She is not ill-appearing or toxic-appearing.  HENT:     Head: Normocephalic and atraumatic.     Right Ear: Hearing, tympanic membrane, ear canal and  external ear normal.     Left Ear: Hearing, tympanic membrane, ear canal and external ear normal.     Mouth/Throat:     Pharynx: Oropharynx is clear.  Eyes:     Extraocular Movements: Extraocular movements intact.     Pupils: Pupils are equal, round, and reactive to light.  Neck:     Thyroid : No thyroid  mass, thyromegaly or thyroid  tenderness.     Vascular: No carotid bruit.  Cardiovascular:     Rate and Rhythm: Normal rate and regular rhythm. No extrasystoles are present.    Pulses:          Dorsalis pedis pulses are 2+ on the right side and 2+ on the left side.     Heart sounds: Normal heart sounds. No murmur heard.    No friction rub. No gallop.  Pulmonary:     Effort: Pulmonary effort is normal.     Breath sounds: Normal breath sounds. No decreased breath sounds, wheezing, rhonchi or rales.  Chest:     Chest wall: No mass.  Abdominal:     Palpations: Abdomen is soft. There is no hepatomegaly, splenomegaly or mass.     Tenderness: There is no abdominal tenderness.     Hernia: No hernia is present.  Musculoskeletal:     Cervical back: Normal range of motion.     Right lower leg: No edema.     Left lower leg: No edema.  Lymphadenopathy:     Cervical: No cervical adenopathy.     Upper Body:     Right upper body: No supraclavicular adenopathy.     Left upper body: No supraclavicular adenopathy.  Skin:    General: Skin is warm and dry.  Neurological:     General: No focal deficit present.     Mental Status: She is alert and oriented to person, place, and time. Mental status is at baseline.     Sensory: Sensation is intact.     Motor: Motor function is intact. No weakness.     Deep Tendon Reflexes: Reflexes are normal and symmetric.  Psychiatric:        Attention and Perception: Attention normal.        Mood and Affect: Mood normal.        Speech: Speech normal.        Behavior: Behavior normal.        Thought Content: Thought content normal.        Cognition and Memory:  Cognition normal.        Judgment: Judgment normal.     Current Outpatient Medications  Medication Instructions   Calcium  Carbonate-Vit D-Min (CALCIUM  1200 PO) 2 times daily   cyclobenzaprine  (FLEXERIL ) 10 MG tablet TAKE 1 TABLET BY MOUTH THREE TIMES A DAY AS NEEDED FOR MUSCLE SPASMS   diclofenac  Sodium (VOLTAREN ) 2 g, Topical, 4 times daily   ergocalciferol  (DRISDOL ) 1.25 MG (50000 UT) capsule Take 1 capsule  by mouth once a week.   fenofibrate  (TRICOR ) 145 MG tablet Take 1 tablet by mouth daily.   ondansetron  (ZOFRAN )  4 mg, Oral, Every 8 hours PRN   rosuvastatin  (CRESTOR ) 20 mg, Oral, Daily   Past Medical History:  Diagnosis Date   Asthma    NO PROBLEMS IN4/5 YRS   Hyperlipidemia    on meds   Osteopenia   Medical/Surgical History Narrative:  Allergic/Intolerant to:  Allergies  Allergen Reactions   Latex Other (See Comments)    Patient states that it just bothers her skin a little bit.    Past Surgical History:  Procedure Laterality Date   ANTERIOR CERVICAL DECOMP/DISCECTOMY FUSION  09/11/2011   Procedure: ANTERIOR CERVICAL DECOMPRESSION/DISCECTOMY FUSION 1 LEVEL;  Surgeon: Fairy JONETTA Levels, MD;  Location: [facility];  Service: Neurosurgery;  Laterality: N/A;  Cervical seven-Thoracic one anterior cervical decompression/discectomy fusion   BILATERAL CARPAL TUNNEL RELEASE Bilateral 08/09/2015   Procedure: BILATERAL CARPAL TUNNEL RELEASE;  Surgeon: Arley Curia, MD;  Location: [facility];  Service: Orthopedics;  Laterality: Bilateral;   COLONOSCOPY  2014   Dr. Brodie-miralax(exc)-TA   POLYPECTOMY  2014   TA   ROTATOR CUFF REPAIR Right 2019   Dr.Supple   TONSILLECTOMY  1962   Family History  Problem Relation Age of Onset   Hyperlipidemia Mother    Hypertension Mother    Colon polyps Father 19   Cancer - Other Father 76       Laryngeal Cancer-uses speaking device   Alcohol abuse Brother    Colon polyps Brother 51   Colon cancer Neg Hx    Stomach cancer  Neg Hx    Esophageal cancer Neg Hx    Rectal cancer Neg Hx    Family History Narrative: Father's family history is not known. Mother with history of hypertension, hyperlipidemia and cardiac arrhythmia. 3 brothers. No sisters. She has 2 children, a son and a daughter, both adults in good health.   Social history: She is a paramedic for [employer]. 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 She is married. Does not smoke or consume alcohol. 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   Most Recent Health Risks Assessment:   Most Recent Social Determinants of Health (Including Hx of Tobacco, Alcohol, and Drug Use) SDOH Screenings   Food Insecurity: No Food Insecurity (07/23/2023)  Housing: Low Risk  (07/23/2023)  Transportation Needs: No Transportation Needs (07/23/2023)  Utilities: Not At Risk (07/23/2023)  Alcohol Screen: Low Risk  (08/08/2024)  Depression (PHQ2-9): Low Risk  (08/08/2024)  Financial Resource Strain: Low Risk  (07/23/2023)  Tobacco Use: Low Risk  (08/08/2024)  Health Literacy: Adequate Health Literacy (07/23/2023)   Social History   Tobacco Use   Smoking status: Never   Smokeless tobacco: Never  Vaping Use   Vaping status: Never Used  Substance Use Topics   Alcohol use: No   Drug use: No   Most Recent Fall Risk Assessment:    09/23/2023    1:08 PM  Fall Risk   Falls in the past year? 0   Most Recent Anxiety/Depression Screenings:    08/08/2024    3:06 PM 09/23/2023    1:08 PM  PHQ 2/9 Scores  PHQ - 2 Score 0 0      08/08/2024    3:06 PM  GAD 7 : Generalized Anxiety Score  Nervous, Anxious, on Edge 0  Control/stop worrying 0  Worry too much - different things 0  Trouble relaxing 1  Restless 0  Easily annoyed or irritable 0  Afraid - awful might happen  1  Total GAD 7 Score 2    Results:  Studies Obtained And Personally Reviewed By Me:   03/23/2024 Mammogram No mammographic evidence of malignancy. Repeat  in one year.   10/10/2022 Colonoscopy Three 4 to 7 mm polyps in the ascending colon. Resected and retrieved. Pathology found to be benign but not precancerous. Non- bleeding external and internal hemorrhoids. The examination was otherwise normal. Repeat in 3 years.   Labs:  CBC w/ Differential Lab Results  Component Value Date   WBC 8.3 08/01/2024   RBC 4.68 08/01/2024   HGB 13.1 08/01/2024   HCT 41.9 08/01/2024   PLT 393 08/01/2024   MCV 89.5 08/01/2024   MCH 28.0 08/01/2024   MCHC 31.3 (L) 08/01/2024   RDW 13.3 08/01/2024   MPV 10.1 08/01/2024   LYMPHSABS 2,768 07/11/2022   MONOABS 395 10/21/2016   BASOSABS 58 08/01/2024    Comprehensive Metabolic Panel Lab Results  Component Value Date   NA 142 08/01/2024   K 4.4 08/01/2024   CL 105 08/01/2024   CO2 28 08/01/2024   GLUCOSE 105 (H) 08/01/2024   BUN 15 08/01/2024   CREATININE 0.97 08/01/2024   CALCIUM  9.9 08/01/2024   PROT 7.1 08/01/2024   ALBUMIN 4.4 10/21/2016   AST 20 08/01/2024   ALT 20 08/01/2024   ALKPHOS 85 10/21/2016   BILITOT 0.5 08/01/2024   EGFR 63 08/01/2024   GFRNONAA 70 07/06/2020   Lipid Panel  Lab Results  Component Value Date   CHOL 168 08/01/2024   HDL 63 08/01/2024   LDLCALC 84 08/01/2024   TRIG 109 08/01/2024   A1c Lab Results  Component Value Date   HGBA1C 6.3 (H) 08/01/2024    TSH Lab Results  Component Value Date   TSH 3.21 08/01/2024    Assessment & Plan:   Orders Placed This Encounter  Procedures   POCT URINALYSIS DIP (CLINITEK)    Hyperlipidemia: treated with rosuvastatin  20 mg daily, fenofibrate  145 mg daily. 08/01/2024 Lipid panel normal.    Impaired glucose tolerance: 08/01/2024 HGBA1c at 6.3%.    Vitamin D  deficiency: treated with Drisdol  50,000 units weekly.   Musculoskeletal pain: for which she has taken Flexeril  from time to time. She said that she has not taken Flexeril  in a while.   Occasional migraine headaches: treated with Zofran . She has not felt  nauseous recently.    Mixed conductive and sensorineural hearing loss. Seen by Audilologist.    03/23/2024 Mammogram No mammographic evidence of malignancy. Repeat in one year.   10/10/2022 Colonoscopy Three 4 to 7 mm polyps in the ascending colon. Resected and retrieved. Pathology found to be benign but not precancerous. Non- bleeding external and internal hemorrhoids. The examination was otherwise normal. Repeat in 3 years.    Vaccine counseling: UTD on Influenza, Covid-19 and Pneumonia vaccines.    Annual Comprehensive Physical Exam done today including the all of the following: Reviewed patient's Family Medical History Reviewed patient's SDOH and reviewed tobacco, alcohol, and drug use.  Reviewed and updated list of patient's medical providers Assessment of cognitive impairment was done Assessed patient's functional ability Established a written schedule for health screening services Health Risk Assessent Completed and Reviewed  Discussed health benefits of physical activity, and encouraged her to engage in regular exercise appropriate for her age and condition.    I,Makayla C Reid,acting as a scribe for Ronal JINNY Hailstone, MD.,have documented all relevant documentation on the behalf of Ronal JINNY Hailstone, MD,as directed by  Ronal JINNY Hailstone,  MD while in the presence of Ronal JINNY Hailstone, MD.  I, Ronal JINNY Hailstone, MD, have reviewed all documentation for and agree with the above Annual Wellness Visit documentation.  Ronal JINNY Hailstone, MD Internal Medicine 08/08/2024

## 2024-08-01 ENCOUNTER — Other Ambulatory Visit: Payer: Self-pay

## 2024-08-01 DIAGNOSIS — M1711 Unilateral primary osteoarthritis, right knee: Secondary | ICD-10-CM

## 2024-08-01 DIAGNOSIS — R7989 Other specified abnormal findings of blood chemistry: Secondary | ICD-10-CM

## 2024-08-01 DIAGNOSIS — Z0000 Encounter for general adult medical examination without abnormal findings: Secondary | ICD-10-CM

## 2024-08-01 DIAGNOSIS — E7439 Other disorders of intestinal carbohydrate absorption: Secondary | ICD-10-CM

## 2024-08-01 DIAGNOSIS — M8580 Other specified disorders of bone density and structure, unspecified site: Secondary | ICD-10-CM

## 2024-08-01 DIAGNOSIS — E782 Mixed hyperlipidemia: Secondary | ICD-10-CM

## 2024-08-02 ENCOUNTER — Ambulatory Visit: Payer: Self-pay | Admitting: Internal Medicine

## 2024-08-02 LAB — CBC WITH DIFFERENTIAL/PLATELET
Absolute Lymphocytes: 2805 {cells}/uL (ref 850–3900)
Absolute Monocytes: 498 {cells}/uL (ref 200–950)
Basophils Absolute: 58 {cells}/uL (ref 0–200)
Basophils Relative: 0.7 %
Eosinophils Absolute: 100 {cells}/uL (ref 15–500)
Eosinophils Relative: 1.2 %
HCT: 41.9 % (ref 35.9–46.0)
Hemoglobin: 13.1 g/dL (ref 11.7–15.5)
MCH: 28 pg (ref 27.0–33.0)
MCHC: 31.3 g/dL — ABNORMAL LOW (ref 31.6–35.4)
MCV: 89.5 fL (ref 81.4–101.7)
MPV: 10.1 fL (ref 7.5–12.5)
Monocytes Relative: 6 %
Neutro Abs: 4839 {cells}/uL (ref 1500–7800)
Neutrophils Relative %: 58.3 %
Platelets: 393 Thousand/uL (ref 140–400)
RBC: 4.68 Million/uL (ref 3.80–5.10)
RDW: 13.3 % (ref 11.0–15.0)
Total Lymphocyte: 33.8 %
WBC: 8.3 Thousand/uL (ref 3.8–10.8)

## 2024-08-02 LAB — LIPID PANEL
Cholesterol: 168 mg/dL (ref <200)
HDL: 63 mg/dL (ref >=50)
LDL Cholesterol (Calc): 84 mg/dL
Non-HDL Cholesterol (Calc): 105 mg/dL (ref <130)
Total CHOL/HDL Ratio: 2.7 (calc) (ref <5.0)
Triglycerides: 109 mg/dL (ref <150)

## 2024-08-02 LAB — HEMOGLOBIN A1C
Hgb A1c MFr Bld: 6.3 % — ABNORMAL HIGH (ref <5.7)
Mean Plasma Glucose: 134 mg/dL
eAG (mmol/L): 7.4 mmol/L

## 2024-08-02 LAB — COMPREHENSIVE METABOLIC PANEL WITH GFR
AG Ratio: 2 (calc) (ref 1.0–2.5)
ALT: 20 U/L (ref 6–29)
AST: 20 U/L (ref 10–35)
Albumin: 4.7 g/dL (ref 3.6–5.1)
Alkaline phosphatase (APISO): 49 U/L (ref 37–153)
BUN: 15 mg/dL (ref 7–25)
CO2: 28 mmol/L (ref 20–32)
Calcium: 9.9 mg/dL (ref 8.6–10.4)
Chloride: 105 mmol/L (ref 98–110)
Creat: 0.97 mg/dL (ref 0.50–1.05)
Globulin: 2.4 g/dL (ref 1.9–3.7)
Glucose, Bld: 105 mg/dL — ABNORMAL HIGH (ref 65–99)
Potassium: 4.4 mmol/L (ref 3.5–5.3)
Sodium: 142 mmol/L (ref 135–146)
Total Bilirubin: 0.5 mg/dL (ref 0.2–1.2)
Total Protein: 7.1 g/dL (ref 6.1–8.1)
eGFR: 63 mL/min/1.73m2 (ref >=60)

## 2024-08-02 LAB — TSH: TSH: 3.21 m[IU]/L (ref 0.40–4.50)

## 2024-08-08 ENCOUNTER — Ambulatory Visit: Payer: Self-pay | Admitting: Internal Medicine

## 2024-08-08 ENCOUNTER — Encounter: Payer: Self-pay | Admitting: Internal Medicine

## 2024-08-08 VITALS — BP 140/90 | HR 78 | Ht 65.0 in | Wt 213.0 lb

## 2024-08-08 DIAGNOSIS — Z0000 Encounter for general adult medical examination without abnormal findings: Secondary | ICD-10-CM | POA: Diagnosis not present

## 2024-08-08 DIAGNOSIS — M7918 Myalgia, other site: Secondary | ICD-10-CM

## 2024-08-08 DIAGNOSIS — Z91030 Bee allergy status: Secondary | ICD-10-CM

## 2024-08-08 DIAGNOSIS — Z8669 Personal history of other diseases of the nervous system and sense organs: Secondary | ICD-10-CM

## 2024-08-08 DIAGNOSIS — E7439 Other disorders of intestinal carbohydrate absorption: Secondary | ICD-10-CM

## 2024-08-08 DIAGNOSIS — E785 Hyperlipidemia, unspecified: Secondary | ICD-10-CM | POA: Diagnosis not present

## 2024-08-08 DIAGNOSIS — M8580 Other specified disorders of bone density and structure, unspecified site: Secondary | ICD-10-CM

## 2024-08-08 DIAGNOSIS — H903 Sensorineural hearing loss, bilateral: Secondary | ICD-10-CM

## 2024-08-08 DIAGNOSIS — R7302 Impaired glucose tolerance (oral): Secondary | ICD-10-CM | POA: Diagnosis not present

## 2024-08-08 DIAGNOSIS — G43909 Migraine, unspecified, not intractable, without status migrainosus: Secondary | ICD-10-CM

## 2024-08-08 DIAGNOSIS — H900 Conductive hearing loss, bilateral: Secondary | ICD-10-CM

## 2024-08-08 DIAGNOSIS — E559 Vitamin D deficiency, unspecified: Secondary | ICD-10-CM | POA: Diagnosis not present

## 2024-08-08 DIAGNOSIS — E782 Mixed hyperlipidemia: Secondary | ICD-10-CM

## 2024-08-08 LAB — POCT URINALYSIS DIP (CLINITEK)
Bilirubin, UA: NEGATIVE
Blood, UA: NEGATIVE
Glucose, UA: NEGATIVE mg/dL
Ketones, POC UA: NEGATIVE mg/dL
Leukocytes, UA: NEGATIVE
Nitrite, UA: NEGATIVE
POC PROTEIN,UA: NEGATIVE
Spec Grav, UA: 1.02 (ref 1.010–1.025)
Urobilinogen, UA: 0.2 U/dL
pH, UA: 6 (ref 5.0–8.0)

## 2024-08-16 ENCOUNTER — Telehealth: Payer: Self-pay | Admitting: Internal Medicine

## 2024-08-16 ENCOUNTER — Encounter: Payer: Self-pay | Admitting: Internal Medicine

## 2024-08-23 NOTE — Telephone Encounter (Signed)
 done

## 2024-08-27 NOTE — Patient Instructions (Signed)
 It was a pleasure to see you today. Continue to work on glucose control with diet and exercise. Return in one year or as needed. Colonoscopy due 2027.

## 2024-08-29 ENCOUNTER — Other Ambulatory Visit (HOSPITAL_COMMUNITY): Payer: Self-pay
